# Patient Record
Sex: Male | Born: 1980 | Race: Black or African American | Hispanic: No | Marital: Married | Smoking: Never smoker
Health system: Southern US, Community
[De-identification: ages and names within clinical notes are randomized; demographics above are authoritative.]

## PROBLEM LIST (undated history)

## (undated) DIAGNOSIS — Z9889 Other specified postprocedural states: Secondary | ICD-10-CM

## (undated) HISTORY — PX: KNEE SURGERY: SHX244

---
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
## 2001-08-23 ENCOUNTER — Emergency Department (HOSPITAL_COMMUNITY): Admission: EM | Admit: 2001-08-23 | Discharge: 2001-08-23 | Payer: Self-pay | Admitting: Emergency Medicine

## 2002-07-21 ENCOUNTER — Emergency Department (HOSPITAL_COMMUNITY): Admission: EM | Admit: 2002-07-21 | Discharge: 2002-07-22 | Payer: Self-pay | Admitting: Emergency Medicine

## 2002-07-21 ENCOUNTER — Encounter: Payer: Self-pay | Admitting: Emergency Medicine

## 2004-04-26 ENCOUNTER — Emergency Department (HOSPITAL_COMMUNITY): Admission: EM | Admit: 2004-04-26 | Discharge: 2004-04-26 | Payer: Self-pay | Admitting: Emergency Medicine

## 2016-09-29 ENCOUNTER — Emergency Department (HOSPITAL_COMMUNITY): Payer: Self-pay

## 2016-09-29 ENCOUNTER — Emergency Department (HOSPITAL_COMMUNITY)
Admission: EM | Admit: 2016-09-29 | Discharge: 2016-09-29 | Disposition: A | Discharge status: Home / Self Care | Payer: Self-pay | Attending: Emergency Medicine | Admitting: Emergency Medicine

## 2016-09-29 ENCOUNTER — Encounter (HOSPITAL_COMMUNITY): Payer: Self-pay

## 2016-09-29 DIAGNOSIS — Y999 Unspecified external cause status: Secondary | ICD-10-CM | POA: Insufficient documentation

## 2016-09-29 DIAGNOSIS — Y939 Activity, unspecified: Secondary | ICD-10-CM | POA: Insufficient documentation

## 2016-09-29 DIAGNOSIS — Y929 Unspecified place or not applicable: Secondary | ICD-10-CM | POA: Insufficient documentation

## 2016-09-29 DIAGNOSIS — M25532 Pain in left wrist: Secondary | ICD-10-CM | POA: Insufficient documentation

## 2016-09-29 MED ORDER — IBUPROFEN 600 MG PO TABS: 600.0000 mg | ORAL_TABLET | Freq: Four times a day (QID) | ORAL | 0 refills | Status: DC | PRN
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
## 2016-09-29 NOTE — Discharge Instructions (Signed)
Take the ibuprofen as prescribed as needed for pain. Be sure to eat with this medication as it can be hard in your stomach. Ice your wrist as often as you can, 20 minutes on, 20 minutes off. Follow up at [facility] in 3-4 days if your symptoms are not improving. Return to the emergency department if you experience numbness, swelling of your hand, blueness of your fingers, weakness, fever or any other concerning symptoms.

## 2016-09-29 NOTE — ED Notes (Signed)
Patient verbalized understanding of discharge instructions and denies any further needs or questions at this time. VS stable. Patient ambulatory with steady gait.  
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
## 2016-10-01 ENCOUNTER — Encounter (HOSPITAL_COMMUNITY): Payer: Self-pay | Admitting: Emergency Medicine

## 2016-10-01 ENCOUNTER — Emergency Department (HOSPITAL_COMMUNITY)
Admission: EM | Admit: 2016-10-01 | Discharge: 2016-10-01 | Disposition: A | Discharge status: Home / Self Care | Payer: Self-pay | Attending: Emergency Medicine | Admitting: Emergency Medicine

## 2016-10-01 DIAGNOSIS — J029 Acute pharyngitis, unspecified: Secondary | ICD-10-CM | POA: Insufficient documentation

## 2016-10-01 MED ORDER — KETOROLAC TROMETHAMINE 30 MG/ML IJ SOLN
30.0000 mg | Freq: Once | INTRAMUSCULAR | Status: AC
  Administered 2016-10-01: 30 mg via INTRAVENOUS
  Filled 2016-10-01: qty 1

## 2016-10-01 MED ORDER — ACETAMINOPHEN 500 MG PO TABS
1000.0000 mg | ORAL_TABLET | Freq: Once | ORAL | Status: AC
  Administered 2016-10-01: 1000 mg via ORAL
  Filled 2016-10-01: qty 2

## 2016-10-01 MED ORDER — METOCLOPRAMIDE HCL 10 MG PO TABS
10.0000 mg | ORAL_TABLET | Freq: Once | ORAL | Status: AC
  Administered 2016-10-01: 10 mg via ORAL
  Filled 2016-10-01: qty 1

## 2016-10-01 MED ORDER — DEXAMETHASONE SODIUM PHOSPHATE 10 MG/ML IJ SOLN
10.0000 mg | Freq: Once | INTRAMUSCULAR | Status: AC
  Administered 2016-10-01: 10 mg via INTRAVENOUS
  Filled 2016-10-01: qty 1

## 2016-10-01 NOTE — ED Triage Notes (Signed)
Pt c/o fever, 10/10 HA, sore throat, for the last 2 days. Pt having a temp on arrival of 100.5 oral. Denies any nausea, vomiting or diarrhea.

## 2016-10-01 NOTE — ED Notes (Signed)
Pt just finished drinking a cup of water so could not check it Orally. Axillary was 101.1
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
## 2018-09-03 ENCOUNTER — Other Ambulatory Visit: Payer: Self-pay

## 2018-09-03 ENCOUNTER — Encounter (HOSPITAL_COMMUNITY): Payer: Self-pay

## 2018-09-03 ENCOUNTER — Emergency Department (HOSPITAL_COMMUNITY)
Admission: EM | Admit: 2018-09-03 | Discharge: 2018-09-03 | Disposition: A | Discharge status: Home / Self Care | Payer: Self-pay | Attending: Emergency Medicine | Admitting: Emergency Medicine

## 2018-09-03 DIAGNOSIS — J039 Acute tonsillitis, unspecified: Secondary | ICD-10-CM | POA: Insufficient documentation

## 2018-09-03 LAB — GROUP A STREP BY PCR: Group A Strep by PCR: NOT DETECTED

## 2018-09-03 MED ORDER — PREDNISONE 10 MG PO TABS: 40.0000 mg | ORAL_TABLET | Freq: Every day | ORAL | 0 refills | Status: DC

## 2018-09-03 MED ORDER — IBUPROFEN 800 MG PO TABS: 800.0000 mg | ORAL_TABLET | Freq: Three times a day (TID) | ORAL | 0 refills | Status: DC

## 2018-09-03 MED ORDER — CLINDAMYCIN HCL 150 MG PO CAPS: 300.0000 mg | ORAL_CAPSULE | Freq: Four times a day (QID) | ORAL | 0 refills | Status: AC

## 2018-09-03 MED ORDER — PREDNISONE 20 MG PO TABS
60.0000 mg | ORAL_TABLET | Freq: Once | ORAL | Status: AC
  Administered 2018-09-03: 60 mg via ORAL
  Filled 2018-09-03: qty 3

## 2018-09-03 MED ORDER — IBUPROFEN 400 MG PO TABS
600.0000 mg | ORAL_TABLET | Freq: Once | ORAL | Status: AC
  Administered 2018-09-03: 600 mg via ORAL
  Filled 2018-09-03: qty 1

## 2018-09-03 MED ORDER — ACETAMINOPHEN 325 MG PO TABS
650.0000 mg | ORAL_TABLET | Freq: Once | ORAL | Status: AC
  Administered 2018-09-03: 650 mg via ORAL
  Filled 2018-09-03: qty 2

## 2018-09-03 NOTE — ED Notes (Signed)
Patient verbalizes understanding of discharge instructions. Opportunity for questioning and answers were provided. Armband removed by staff, pt discharged from ED to his home via POV.  

## 2018-09-03 NOTE — Discharge Instructions (Signed)
Please take all of your antibiotics until finished!   You may develop abdominal discomfort or diarrhea from the antibiotic.  You may help offset this with probiotics which you can buy or get in yogurt. Do not eat or take the probiotics until 2 hours after your antibiotic. Do not take your medicine if develop an itchy rash, swelling in your mouth or lips, or difficulty breathing.   Prednisone - This is an oral steroid.  This medication is best taken with food in the morning.  Please note that this medication can cause anxiety, mood swings, muscle fatigue, increased hunger, weight gain (sodium/fluid retention), poor sleep as well as other symptoms. If you are a diabetic, please monitor your blood sugars at home as this medication can increase your blood sugars.   For pain and fever control you may take: 800mg  of ibuprofen (that is usually four 200mg  over the counter pills) up to 3 times a day (please take with food) and acetaminophen 975mg  (this is 3 normal strength, 325mg , over the counter pills) up to four times a day. Please do not take more than this. Do not drink alcohol or combine with other medications that have acetaminophen or Ibuprofen as an ingredient (Read the labels!).   Follow attached handout.   If you develop worsening or new concerning symptoms you can return to the emergency department for re-evaluation.

## 2018-09-03 NOTE — ED Provider Notes (Signed)
[facility] EMERGENCY DEPARTMENT Provider Note   CSN: [id number] Arrival date & time: 09/03/18  1708     History   Chief Complaint Chief Complaint  Patient presents with  . Fever  . Sore Throat    HPI Shawn Molina is a 37 y.o. male with no significant past medical history who presents with fever and sore throat that began on thursday. The patient reports of having fever of 102 as well as sore throat with associated dysphasia.  He has been using ibuprofen for his symptoms last dose at noon today.  Nothing has been making his symptoms better. He denies any sick contacts. Denies inability to control secretions, N/V, abdominal pain, cough, congestion, voice change, dental disease, oral sex, or trauma.   HPI  History reviewed. No pertinent past medical history.  There are no active problems to display for this patient.   History reviewed. No pertinent surgical history.      Home Medications    Prior to Admission medications   Medication Sig Start Date End Date Taking? Authorizing Provider  ibuprofen (ADVIL,MOTRIN) 600 MG tablet Take 1 tablet (600 mg total) by mouth every 6 (six) hours as needed. 09/29/16   Jerre Simon, PA    Family History History reviewed. No pertinent family history.  Social History Social History   Tobacco Use  . Smoking status: Never Smoker  . Smokeless tobacco: Never Used  Substance Use Topics  . Alcohol use: Yes    Comment: social  . Drug use: Yes    Types: Marijuana    Comment: occ     Allergies   Patient has no known allergies.   Review of Systems Review of Systems  Constitutional: Positive for fever.  HENT: Positive for sore throat. Negative for congestion, dental problem, facial swelling, rhinorrhea, sinus pressure, sinus pain and trouble swallowing.   Eyes: Negative for itching.  Respiratory: Negative for cough and shortness of breath.   Cardiovascular: Negative for chest pain.  Gastrointestinal:  Negative for nausea and vomiting.  Skin: Negative for rash.     Physical Exam Updated Vital Signs BP 120/69   Pulse 88   Temp 99.7 F (37.6 C) (Oral)   Resp 20   Ht 5\' 11"  (1.803 m)   Wt 111.1 kg   SpO2 99%   BMI 34.17 kg/m   Physical Exam  Constitutional: He appears well-developed and well-nourished. No distress.  HENT:  Head: Normocephalic and atraumatic.  Right Ear: Hearing, tympanic membrane, external ear and ear canal normal. No foreign bodies. No mastoid tenderness. Tympanic membrane is not injected, not perforated, not erythematous, not retracted and not bulging.  Left Ear: Hearing, tympanic membrane, external ear and ear canal normal. No foreign bodies. No mastoid tenderness. Tympanic membrane is not injected, not perforated, not erythematous, not retracted and not bulging.  Nose: Nose normal. No mucosal edema, rhinorrhea, sinus tenderness, septal deviation or nasal septal hematoma.  No foreign bodies. Right sinus exhibits no maxillary sinus tenderness and no frontal sinus tenderness. Left sinus exhibits no maxillary sinus tenderness and no frontal sinus tenderness.  The patient has normal phonation and is in control of secretions. No stridor.  Midline uvula without edema. Soft palate rises symmetrically. 3+ tonsils b/l with tonsillar erythema and exudates. No PTA. Tongue protrusion is normal. No trismus. No creptius on neck palpation and patient has good dentition. No gingival erythema or fluctuance noted. Mucus membranes moist.  Eyes: Pupils are equal, round, and reactive to light. Conjunctivae,  EOM and lids are normal. Right eye exhibits no discharge. Left eye exhibits no discharge. Right conjunctiva is not injected. Left conjunctiva is not injected.  Neck: Trachea normal, normal range of motion, full passive range of motion without pain and phonation normal. Neck supple. No spinous process tenderness and no muscular tenderness present. No neck rigidity. No tracheal deviation  and normal range of motion present.  No nuchal rigidity or meningismus  Cardiovascular: Normal rate and regular rhythm.  Pulmonary/Chest: Effort normal and breath sounds normal. No stridor. He has no wheezes.  Abdominal: Soft.  Lymphadenopathy:       Head (right side): No submental, no submandibular, no tonsillar, no preauricular, no posterior auricular and no occipital adenopathy present.       Head (left side): No submental, no submandibular, no tonsillar, no preauricular, no posterior auricular and no occipital adenopathy present.    He has cervical adenopathy.  Neurological: He is alert.  Skin: Skin is warm and dry. No rash noted.  Psychiatric: He has a normal mood and affect.  Nursing note and vitals reviewed.    ED Treatments / Results  Labs (all labs ordered are listed, but only abnormal results are displayed) Labs Reviewed  GROUP A STREP BY PCR    EKG None  Radiology No results found.  Procedures Procedures (including critical care time)  Medications Ordered in ED Medications  ibuprofen (ADVIL,MOTRIN) tablet 600 mg (has no administration in time range)  acetaminophen (TYLENOL) tablet 650 mg (has no administration in time range)  predniSONE (DELTASONE) tablet 60 mg (has no administration in time range)     Initial Impression / Assessment and Plan / ED Course  I have reviewed the triage vital signs and the nursing notes.  Pertinent labs & imaging results that were available during my care of the patient were reviewed by me and considered in my medical decision making (see chart for details).     37 y.o. male with fever, tonsillar edema/exudates with cervical lymphadenopathy and reported dysphasia.  No evidence of PTA or RPA.  Patient symptoms and exam consistent with bacterial tonsillitis.  Patient treated in the ED with steroids, NSAIDs.  Will be discharged home on short burst of steroids as well as clindamycin.  He has intact airway and tolerating greater than 6  ounces of fluid.  Specific return precautions were discussed.  Patient is to follow-up with his primary care provider this week.  Patient appears safe for discharge.  Final Clinical Impressions(s) / ED Diagnoses   Final diagnoses:  Tonsillitis    ED Discharge Orders         Ordered    predniSONE (DELTASONE) 10 MG tablet  Daily     09/03/18 2007    clindamycin (CLEOCIN) 150 MG capsule  Every 6 hours     09/03/18 2007    ibuprofen (ADVIL,MOTRIN) 800 MG tablet  3 times daily     09/03/18 2007           Princella Pellegrini 09/04/18 Juventino Slovak, MD 09/07/18 1504

## 2018-09-03 NOTE — ED Notes (Signed)
Pt complains of a sore throat for 4 days. Pt states warm tea helps but the pain comes back again.

## 2018-09-03 NOTE — ED Triage Notes (Signed)
Pt here for a febrile illness over the last week.  Endorses sore throat for the same time.  Treating with Asprin and hot tea without resolution of soreness.  A&Ox4.

## 2019-02-03 ENCOUNTER — Other Ambulatory Visit: Payer: Self-pay

## 2019-02-03 ENCOUNTER — Emergency Department (HOSPITAL_COMMUNITY)
Admission: EM | Admit: 2019-02-03 | Discharge: 2019-02-03 | Disposition: A | Discharge status: Home / Self Care | Payer: Self-pay | Attending: Emergency Medicine | Admitting: Emergency Medicine

## 2019-02-03 ENCOUNTER — Encounter (HOSPITAL_COMMUNITY): Payer: Self-pay

## 2019-02-03 DIAGNOSIS — L0201 Cutaneous abscess of face: Secondary | ICD-10-CM | POA: Insufficient documentation

## 2019-02-03 DIAGNOSIS — L03213 Periorbital cellulitis: Secondary | ICD-10-CM | POA: Insufficient documentation

## 2019-02-03 DIAGNOSIS — F1210 Cannabis abuse, uncomplicated: Secondary | ICD-10-CM | POA: Insufficient documentation

## 2019-02-03 HISTORY — DX: Other specified postprocedural states: Z98.890

## 2019-02-03 MED ORDER — SULFAMETHOXAZOLE-TRIMETHOPRIM 800-160 MG PO TABS: 1.0000 | ORAL_TABLET | Freq: Two times a day (BID) | ORAL | 0 refills | Status: AC

## 2019-02-03 MED ORDER — AMOXICILLIN-POT CLAVULANATE 875-125 MG PO TABS: 1.0000 | ORAL_TABLET | Freq: Two times a day (BID) | ORAL | 0 refills | Status: AC

## 2019-02-03 MED ORDER — SULFAMETHOXAZOLE-TRIMETHOPRIM 800-160 MG PO TABS
1.0000 | ORAL_TABLET | Freq: Once | ORAL | Status: AC
  Administered 2019-02-03: 1 via ORAL
  Filled 2019-02-03: qty 1

## 2019-02-03 MED ORDER — LIDOCAINE HCL 2 % IJ SOLN
10.0000 mL | Freq: Once | INTRAMUSCULAR | Status: AC
  Administered 2019-02-03: 200 mg
  Filled 2019-02-03: qty 20

## 2019-02-03 MED ORDER — AMOXICILLIN-POT CLAVULANATE 875-125 MG PO TABS
1.0000 | ORAL_TABLET | Freq: Once | ORAL | Status: AC
  Administered 2019-02-03: 1 via ORAL
  Filled 2019-02-03: qty 1

## 2019-02-03 NOTE — ED Provider Notes (Signed)
[facility]-EMERGENCY DEPT Provider Note   CSN: [id number] Arrival date & time: 02/03/19  0750    History   Chief Complaint Chief Complaint  Patient presents with  . Abscess    HPI Shawn Molina is a 38 y.o. male.     HPI  Patient is a 38 year old male with no significant past medical history presenting for "lump" superior to the left eyebrow and periorbital swelling on the left.  Patient reports that he began noticing the lump approximately 3 weeks ago.  Reports over the past 48 hours he has had progressive increase in erythema overlying the lump, as well as some swelling surrounding the eye.  Patient denies any visual disturbance, pain with extraocular motions, or fever or chills.  No history of immune compromise status.  Past Medical History:  Diagnosis Date  . H/O left knee surgery     There are no active problems to display for this patient.   History reviewed. No pertinent surgical history.      Home Medications    Prior to Admission medications   Medication Sig Start Date End Date Taking? Authorizing Provider  ibuprofen (ADVIL,MOTRIN) 800 MG tablet Take 1 tablet (800 mg total) by mouth 3 (three) times daily. 09/03/18   Maczis, Elmer Sow, PA-C  predniSONE (DELTASONE) 10 MG tablet Take 4 tablets (40 mg total) by mouth daily. 09/03/18   Maczis, Elmer Sow, PA-C    Family History Family History  Problem Relation Age of Onset  . Cancer Mother     Social History Social History   Tobacco Use  . Smoking status: Never Smoker  . Smokeless tobacco: Never Used  Substance Use Topics  . Alcohol use: Yes    Comment: social  . Drug use: Yes    Types: Marijuana    Comment: occ     Allergies   Patient has no known allergies.   Review of Systems Review of Systems  Constitutional: Negative for chills and fever.  HENT: Negative for congestion and sore throat.   Eyes: Positive for redness. Negative for pain, discharge and visual  disturbance.  Respiratory: Negative for chest tightness.   Cardiovascular: Negative for chest pain.  Gastrointestinal: Negative for abdominal pain, nausea and vomiting.  Genitourinary: Negative for dysuria and flank pain.  Musculoskeletal: Negative for back pain and myalgias.  Skin: Negative for rash.  Allergic/Immunologic: Negative for immunocompromised state.  Neurological: Negative for dizziness, syncope and headaches.     Physical Exam Updated Vital Signs BP (!) 140/91 (BP Location: Right Arm)   Pulse 83   Temp 99.5 F (37.5 C) (Oral)   Resp 19   Ht 6' (1.829 m)   Wt 111.1 kg   SpO2 99%   BMI 33.23 kg/m   Physical Exam Vitals signs and nursing note reviewed.  Constitutional:      General: He is not in acute distress.    Appearance: He is well-developed.  HENT:     Head: Normocephalic and atraumatic.  Eyes:     Conjunctiva/sclera: Conjunctivae normal.     Pupils: Pupils are equal, round, and reactive to light.   Neck:     Musculoskeletal: Normal range of motion and neck supple.  Cardiovascular:     Rate and Rhythm: Normal rate and regular rhythm.     Heart sounds: S1 normal and S2 normal. No murmur.  Pulmonary:     Effort: Pulmonary effort is normal.     Breath sounds: Normal breath sounds. No wheezing or  rales.  Abdominal:     General: There is no distension.     Palpations: Abdomen is soft.     Tenderness: There is no abdominal tenderness. There is no guarding.  Musculoskeletal: Normal range of motion.        General: No deformity.  Lymphadenopathy:     Cervical: No cervical adenopathy.  Skin:    General: Skin is warm and dry.     Findings: No erythema or rash.  Neurological:     Mental Status: He is alert.     Comments: Cranial nerves grossly intact. Patient moves extremities symmetrically and with good coordination.  Psychiatric:        Behavior: Behavior normal.        Thought Content: Thought content normal.        Judgment: Judgment normal.       ED Treatments / Results  Labs (all labs ordered are listed, but only abnormal results are displayed) Labs Reviewed - No data to display  EKG None  Radiology No results found.  Procedures .Marland KitchenIncision and Drainage Date/Time: 02/03/2019 10:20 AM Performed by: Elisha Ponder, PA-C Authorized by: Elisha Ponder, PA-C   Consent:    Consent obtained:  Verbal   Consent given by:  Patient   Risks discussed:  Bleeding, incomplete drainage and pain Location:    Type:  Abscess   Location:  Head   Head location:  Face (Superior to left eyebrow) Pre-procedure details:    Skin preparation:  Betadine Anesthesia (see MAR for exact dosages):    Anesthesia method:  Local infiltration   Local anesthetic:  Lidocaine 2% w/o epi Procedure type:    Complexity:  Simple Procedure details:    Incision types:  Stab incision   Incision depth:  Dermal   Scalpel blade:  11   Wound management:  Probed and deloculated   Drainage:  Purulent   Drainage amount:  Moderate   Wound treatment:  Wound left open   Packing materials:  None Post-procedure details:    Patient tolerance of procedure:  Tolerated well, no immediate complications   (including critical care time)  Medications Ordered in ED Medications  lidocaine (XYLOCAINE) 2 % (with pres) injection 200 mg (has no administration in time range)  sulfamethoxazole-trimethoprim (BACTRIM DS,SEPTRA DS) 800-160 MG per tablet 1 tablet (has no administration in time range)  amoxicillin-clavulanate (AUGMENTIN) 875-125 MG per tablet 1 tablet (has no administration in time range)     Initial Impression / Assessment and Plan / ED Course  I have reviewed the triage vital signs and the nursing notes.  Pertinent labs & imaging results that were available during my care of the patient were reviewed by me and considered in my medical decision making (see chart for details).        Patient nontoxic-appearing, afebrile, and in no acute  distress.  Patient with small abscess superior to the left eyebrow.  Patient has no immune compromising risk factors.  Patient with small abscess superior to the left eyebrow, with surrounding periorbital edema, possibly early periorbital cellulitis.  There is no proptosis, pain with extraocular motions, visual disturbance, or concerning signs or symptoms on exam for post septal cellulitis.  Incision and drainage was performed successfully.  Patient placed on Bactrim and Augmentin for periorbital cellulitis regimen.  Patient was given strict return precautions for return of abscess, any increasing swelling, redness, visual disturbance, or pain behind the eye.  Patient is in understanding and agrees to the plan of  care.  Final Clinical Impressions(s) / ED Diagnoses   Final diagnoses:  Abscess of face  Periorbital cellulitis of left eye    ED Discharge Orders         Ordered    sulfamethoxazole-trimethoprim (BACTRIM DS,SEPTRA DS) 800-160 MG tablet  2 times daily     02/03/19 1015    amoxicillin-clavulanate (AUGMENTIN) 875-125 MG tablet  Every 12 hours     02/03/19 1015           Delia Chimes 02/03/19 1021    Alvira Monday, MD 02/03/19 2114

## 2019-02-03 NOTE — Discharge Instructions (Addendum)
Please see the information and instructions below regarding your visit.  Your diagnoses today include:  1. Abscess of face   2. Periorbital cellulitis of left eye     Abscesses form when an infection in your skin starts to collect bacteria and white blood cells, walling it off from the rest of your body to protect you from a bigger infection. Risk factors for this type of infection include:  ?Break in the skin ?Diabetes ?Swollen areas  Sometimes the infection starts to spread to surrounding tissue, causing redness and swelling. We call this cellulitis.   Tests performed today include: See side panel of your discharge paperwork for testing performed today. Vital signs are listed at the bottom of these instructions.   Medications prescribed:    Take any prescribed medications only as prescribed, and any over the counter medications only as directed on the packaging.  1. Please take all of your antibiotics until finished.   You may develop abdominal discomfort or nausea from the antibiotic. If this occurs, you may take it with food. Some patients also get diarrhea with antibiotics. You may help offset this with probiotics which you can buy or get in yogurt. Do not eat or take the probiotics until 2 hours after your antibiotic. Some women develop vaginal yeast infections after antibiotics. If you develop unusual vaginal discharge after being on this medication, please see your primary care provider.   Some people develop allergies to antibiotics. Symptoms of antibiotic allergy can be mild and include a flat rash and itching. They can also be more serious and include:  ?Hives - Hives are raised, red patches of skin that are usually very itchy.  ?Lip or tongue swelling  ?Trouble swallowing or breathing  ?Blistering of the skin or mouth.  If you have any of these serious symptoms, please seek emergency medical care immediately.  2.  I recommend Tylenol, 650 mg every 6 hours as needed for  pain.  Do not exceed 4000 mg in 1 day.  Home care instructions:  Please follow any educational materials contained in this packet.   Some things that may promote healing of your wound and infection include:  Keep the infected area clean and dry. You can take a shower or bath, but be sure to pat the area dry with a towel afterward. Do not put any antibiotic ointments or creams on the area. Reapply a dry gauze dressing any time the bandage has become soaked with drainage, or after cleansing the wound.  Apply warm compresses to the wound 3-4 times daily to encourage drainage.   Return instructions:  Please return to the Emergency Department if you experience worsening symptoms. You should return for reevaluation of your infection if you notice spreading redness, increased swelling, an abscess develops, or you develop signs and symptoms of a systemic illness such as fever and chills.  I expect the swelling to not change much over 24 hours.  If after 24 hours there is increasing swelling, redness, or puffiness about the eye, please return. Please return if you have any other emergent concerns.  Additional Information:   Your vital signs today were: BP (!) 140/91 (BP Location: Right Arm)    Pulse 83    Temp 99.5 F (37.5 C) (Oral)    Resp 19    Ht 6' (1.829 m)    Wt 111.1 kg    SpO2 99%    BMI 33.23 kg/m  If your blood pressure (BP) was elevated on multiple readings  during this visit above 130 for the top number or above 80 for the bottom number, please have this repeated by your primary care provider within one month. --------------  Thank you for allowing [place] to participate in your care today.

## 2019-02-03 NOTE — ED Triage Notes (Signed)
Patient c/o small abscess to the left eyebrow area since yesterday.

## 2019-02-13 ENCOUNTER — Emergency Department (HOSPITAL_COMMUNITY)
Admission: EM | Admit: 2019-02-13 | Discharge: 2019-02-13 | Disposition: A | Discharge status: Home / Self Care | Payer: Self-pay | Attending: Emergency Medicine | Admitting: Emergency Medicine

## 2019-02-13 ENCOUNTER — Other Ambulatory Visit: Payer: Self-pay

## 2019-02-13 ENCOUNTER — Encounter (HOSPITAL_COMMUNITY): Payer: Self-pay

## 2019-02-13 DIAGNOSIS — R1013 Epigastric pain: Secondary | ICD-10-CM | POA: Insufficient documentation

## 2019-02-13 LAB — COMPREHENSIVE METABOLIC PANEL
ALBUMIN: 4.2 g/dL (ref 3.5–5.0)
ALT: 41 U/L (ref 0–44)
AST: 49 U/L — AB (ref 15–41)
Alkaline Phosphatase: 67 U/L (ref 38–126)
Anion gap: 8 (ref 5–15)
BILIRUBIN TOTAL: 0.7 mg/dL (ref 0.3–1.2)
BUN: 13 mg/dL (ref 6–20)
CO2: 27 mmol/L (ref 22–32)
CREATININE: 1.07 mg/dL (ref 0.61–1.24)
Calcium: 9.1 mg/dL (ref 8.9–10.3)
Chloride: 104 mmol/L (ref 98–111)
GFR calc Af Amer: >60 mL/min (ref >60)
GLUCOSE: 95 mg/dL (ref 70–99)
Potassium: 4.6 mmol/L (ref 3.5–5.1)
Sodium: 139 mmol/L (ref 135–145)
Total Protein: 7.9 g/dL (ref 6.5–8.1)

## 2019-02-13 LAB — CBC
HEMATOCRIT: 43.5 % (ref 39.0–52.0)
Hemoglobin: 13.6 g/dL (ref 13.0–17.0)
MCH: 28.3 pg (ref 26.0–34.0)
MCHC: 31.3 g/dL (ref 30.0–36.0)
MCV: 90.4 fL (ref 80.0–100.0)
Platelets: 273 10*3/uL (ref 150–400)
RBC: 4.81 MIL/uL (ref 4.22–5.81)
RDW: 14.4 % (ref 11.5–15.5)
WBC: 5.8 10*3/uL (ref 4.0–10.5)
nRBC: 0 % (ref 0.0–0.2)

## 2019-02-13 LAB — URINALYSIS, ROUTINE W REFLEX MICROSCOPIC
Bilirubin Urine: NEGATIVE
Glucose, UA: NEGATIVE mg/dL
Hgb urine dipstick: NEGATIVE
Ketones, ur: NEGATIVE mg/dL
Leukocytes,Ua: NEGATIVE
NITRITE: NEGATIVE
PROTEIN: 30 mg/dL — AB
Specific Gravity, Urine: 1.025 (ref 1.005–1.030)
pH: 8 (ref 5.0–8.0)

## 2019-02-13 LAB — LIPASE, BLOOD: Lipase: 42 U/L (ref 11–51)

## 2019-02-13 MED ORDER — SODIUM CHLORIDE 0.9% FLUSH: 3.0000 mL | Freq: Once | INTRAVENOUS | Status: DC

## 2019-02-13 MED ORDER — ONDANSETRON 4 MG PO TBDP: 4.0000 mg | ORAL_TABLET | Freq: Three times a day (TID) | ORAL | 0 refills | Status: DC | PRN

## 2019-02-13 NOTE — ED Provider Notes (Signed)
[facility]-EMERGENCY DEPT Provider Note   CSN: [id number] Arrival date & time: 02/13/19  0259    History   Chief Complaint Chief Complaint  Patient presents with  . Abdominal Pain    HPI Shawn Molina is a 38 y.o. male with no significant past medical history is here for evaluation of abdominal pain.  Onset 1 day ago.  Sudden, constant described as crampy are around his bellybutton with intermittent radiation to his epigastrium and left upper quadrant.  Associated with nausea and one episode of nonbilious nonbloody emesis.  No changes to pain with eating.  Reports intermittent issues with indigestion that have slightly worsened.  Does not take any medicines for this.  No interventions for this.  States 2 weeks ago he was seen in the ER for an eye infection.  He has been taking Bactrim twice a day since with food and thinks that this may be causing his symptoms.  Partner at bedside states last time he took Bactrim he had similar abdominal pain with diarrhea.    Patient denies sick contacts.  No associated fevers, chills, chest pain, shortness of breath, pleuritic pain, changes to bowel movements, urinary symptoms.  No previous abdominal surgeries.  Occasional Aleve use as needed for aches and pains but not frequently.  No EtOH use.  No history of pancreatitis, gastritis, PUD. Since arrival to ER pain has resolved, currently 0/10.        HPI  Past Medical History:  Diagnosis Date  . H/O left knee surgery     There are no active problems to display for this patient.   History reviewed. No pertinent surgical history.      Home Medications    Prior to Admission medications   Medication Sig Start Date End Date Taking? Authorizing Provider  ibuprofen (ADVIL,MOTRIN) 800 MG tablet Take 1 tablet (800 mg total) by mouth 3 (three) times daily. Patient not taking: Reported on 02/13/2019 09/03/18   Jacinto Halim, PA-C  ondansetron (ZOFRAN ODT) 4 MG  disintegrating tablet Take 1 tablet (4 mg total) by mouth every 8 (eight) hours as needed for nausea or vomiting. 02/13/19   Liberty Handy, PA-C  predniSONE (DELTASONE) 10 MG tablet Take 4 tablets (40 mg total) by mouth daily. Patient not taking: Reported on 02/13/2019 09/03/18   Jacinto Halim, PA-C    Family History Family History  Problem Relation Age of Onset  . Cancer Mother     Social History Social History   Tobacco Use  . Smoking status: Never Smoker  . Smokeless tobacco: Never Used  Substance Use Topics  . Alcohol use: Yes    Comment: social  . Drug use: Yes    Types: Marijuana    Comment: occ     Allergies   Patient has no known allergies.   Review of Systems Review of Systems  Gastrointestinal: Positive for abdominal pain, nausea and vomiting.  All other systems reviewed and are negative.    Physical Exam Updated Vital Signs BP 124/87 (BP Location: Left Arm)   Pulse 60   Temp 98.6 F (37 C) (Oral)   Resp 18   Ht 6' (1.829 m)   Wt 113.4 kg   SpO2 99%   BMI 33.91 kg/m   Physical Exam Vitals signs and nursing note reviewed.  Constitutional:      Appearance: He is well-developed.     Comments: Non toxic.  HENT:     Head: Normocephalic and atraumatic.  Nose: Nose normal.  Eyes:     Conjunctiva/sclera: Conjunctivae normal.     Pupils: Pupils are equal, round, and reactive to light.  Neck:     Musculoskeletal: Normal range of motion.  Cardiovascular:     Rate and Rhythm: Normal rate and regular rhythm.     Heart sounds: Normal heart sounds.  Pulmonary:     Effort: Pulmonary effort is normal.     Breath sounds: Normal breath sounds.  Abdominal:     General: Bowel sounds are normal.     Palpations: Abdomen is soft.     Tenderness: There is no abdominal tenderness.     Comments: No G/R/R. No suprapubic or CVA tenderness. Negative Murphy's and McBurney's  Musculoskeletal: Normal range of motion.  Skin:    General: Skin is warm and  dry.     Capillary Refill: Capillary refill takes less than 2 seconds.  Neurological:     Mental Status: He is alert and oriented to person, place, and time.  Psychiatric:        Behavior: Behavior normal.        Thought Content: Thought content normal.        Judgment: Judgment normal.      ED Treatments / Results  Labs (all labs ordered are listed, but only abnormal results are displayed) Labs Reviewed  COMPREHENSIVE METABOLIC PANEL - Abnormal; Notable for the following components:      Result Value   AST 49 (*)    All other components within normal limits  URINALYSIS, ROUTINE W REFLEX MICROSCOPIC - Abnormal; Notable for the following components:   APPearance HAZY (*)    Protein, ur 30 (*)    Bacteria, UA RARE (*)    All other components within normal limits  LIPASE, BLOOD  CBC    EKG None  Radiology No results found.  Procedures Procedures (including critical care time)  Medications Ordered in ED Medications  sodium chloride flush (NS) 0.9 % injection 3 mL (has no administration in time range)     Initial Impression / Assessment and Plan / ED Course  I have reviewed the triage vital signs and the nursing notes.  Pertinent labs & imaging results that were available during my care of the patient were reviewed by me and considered in my medical decision making (see chart for details).       Suspect symptoms may be viral process versus gastritis versus GERD versus PUD.  Recent antibiotic use could be contributing as well.  Upon arrival to the ER patient's pain had completely resolved.  Exam is reassuring without fever, abdominal tenderness.  Negative Murphy's and McBurney's.  He has no associated chest pain or shortness of breath, doubt this is referred cardiac etiology pain.  No urinary symptoms.  I have low suspicion for intraabdominal life-threatening issue that would require admission, advanced imaging such as pancreatitis, cholecystitis, appendicitis,  diverticulitis, SBO, mesenteric ischemia, UTI or pyelonephritis given his benign lab work, UA and exam.  EKG obtained at triage w/o ischemia.  He has noticed recent increase in indigestion.  We will discharge with omeprazole, famotidine and diet changes.  Return precautions were given.  Patient is comfortable with this.    Final Clinical Impressions(s) / ED Diagnoses   Final diagnoses:  Epigastric abdominal pain    ED Discharge Orders         Ordered    ondansetron (ZOFRAN ODT) 4 MG disintegrating tablet  Every 8 hours PRN     02/13/19  8022           Liberty Handy, PA-C 02/13/19 3361    Nira Conn, MD 02/13/19 [phone]

## 2019-02-13 NOTE — ED Notes (Signed)
Asked for urine  

## 2019-02-13 NOTE — Discharge Instructions (Addendum)
You were seen in the ER for fabdominal pain. Labs were normal. I suspect your symptoms may be from gastritis, acid reflux or side effect from antibiotic.  Could have also been a virus. All of these are managed similarly.   We will treat this with anti-acid medicines.  Start taking over the counter omeprazole to 40 mg daily, take on an empty stomach first thing in the morning and wait 20-30 min before eating.  Add famotidine 20 mg in the AM and PM, or as needed for acid reflux or indigestion.  Take tylenol for pain.  Zofran for nausea.   Avoid irritating foods and liquids such as alcohol, greasy/fatty or acidic foods. Avoid ibuprofen containing products.   Follow up with gastroenterology for further management of this if it persits.   Return to the ER for fever, chills, blood in vomit or stool, worsening localized abdominal pain to right upper or right lower abdomen, inability to tolerate fluids.

## 2019-02-13 NOTE — ED Triage Notes (Signed)
Pt states over the last two days he has had abdominal/epigastric pain, heart burn, and finger tingling.   Pt given antibiotics about a week ago for a cyst over the eye.

## 2019-03-06 ENCOUNTER — Telehealth: Payer: Self-pay | Admitting: Physician Assistant

## 2019-03-06 DIAGNOSIS — R509 Fever, unspecified: Secondary | ICD-10-CM

## 2019-03-06 NOTE — Progress Notes (Signed)
E-Visit for Corona Virus Screening  Based on your current symptoms, you may very well have the virus, however your symptoms are mild. Currently, not all patients are being tested. If the symptoms are mild and there is not a known exposure, performing the test is not indicated.  Coronavirus disease 2019 (COVID-19)is a respiratory illness that can spread from person to person. The virus that causes COVID-19 is a new virus that was first identified in the country of [place] but is now found in multiple other countries and has spread to the [place].  Symptoms associated with the virus are mild to severe fever, cough, and shortness of breath. There is currently no vaccine to protect against COVID-19, and there is no specific antiviral treatment for the virus.   To be considered HIGH RISK for Coronavirus (COVID-19), you have to meet the following criteria:  . Traveled to [place], [place], [place], [place] or [place]; or in the [place] to [place], [place], [place], or [place]; and have fever, cough, and shortness of breath within the last 2 weeks of travel OR  . Been in close contact with a person diagnosed with COVID-19 within the last 2 weeks and have fever, cough, and shortness of breath  . IF YOU DO NOT MEET THESE CRITERIA, YOU ARE CONSIDERED LOW RISK FOR COVID-19.   It is vitally important that if you feel that you have an infection such as this virus or any other virus that you stay home and away from places where you may spread it to others.  You should self-quarantine for 14 days if you have symptoms that could potentially be coronavirus and avoid contact with people age 40 and older.    You may also take acetaminophen (Tylenol) as needed for fever. For further treatment of sore throat, I recommend gargling warm salt water for 10-15 seconds as needed.     Reduce your risk of any infection by using the same precautions used for avoiding the common cold or flu: Wash your hands  often with soap and warm water for at least 20 seconds.  If soap and water are not readily available, use an alcohol-based hand sanitizer with at least 60% alcohol.  If coughing or sneezing, cover your mouth and nose by coughing or sneezing into the elbow areas of your shirt or coat, into a tissue or into your sleeve (not your hands). Avoid shaking hands with others and consider head nods or verbal greetings only.  Avoid touching your eyes,nose, or mouth with unwashed hands. Avoid close contact with people who are sick. Avoid places or events with large numbers of people in one location, like concerts or sporting events. Carefully consider travel plans you have or are making. If you are planning any travel outside or inside the [place], visit the CDC'sTravelers' Health webpagefor the latest health notices. If you have some symptoms but not all symptoms, continue to monitor at home and seek medical attention if your symptoms worsen. If you are having a medical emergency, call 911.  HOME CARE Only take medications as instructed by your medical team. Drink plenty of fluids and get plenty of rest. A steam or ultrasonic humidifier can help if you have congestion.   GET HELP RIGHT AWAY IF: You develop worsening fever. You become short of breath You cough up blood. Your symptoms become more severe MAKE SURE YOU  Understand these instructions. Will watch your condition. Will get help right away if you are not doing well  or get worse.  Your e-visit answers were reviewed by a board certified advanced clinical practitioner to complete your personal care plan.  Depending on the condition, your plan could have included both over the counter or prescription medications.  If there is a problem please reply once you have received a response from your provider. Your safety is important to [place].  If you have drug allergies check your prescription carefully.    You can use MyChart to ask questions about  today's visit, request a non-urgent call back, or ask for a work or school excuse for 24 hours related to this e-Visit. If it has been greater than 24 hours you will need to follow up with your provider, or enter a new e-Visit to address those concerns. You will get an e-mail in the next two days asking about your experience.  I hope that your e-visit has been valuable and will speed your recovery. Thank you for using e-visits.    ===View-only below this line===   ----- Message -----    From: Roanna Raider    Sent: 03/06/2019  8:45 AM EDT      To: E-Visit Mailing List Subject: E-Visit Submission: CoronaVirus (COVID-19) Screening  E-Visit Submission: CoronaVirus (COVID-19) Screening --------------------------------  Question: Do you have any of the following?  Answer:   Fever  Question: Do you have any of the following additional symptoms?  Answer:   Sore throat            Headache  Question: Have you had a fever? Answer:   Yes  Question: If you are running a fever, please type in your temperature reading Answer:   100.3  Question: How long have you had the fever? Answer:   Just today  Question: Have others in your home or workplace had similar symptoms? Answer:   No  Question: When did your symptoms start? Answer:   Sunday night  Question: Have you recently visited any of the following countries? Answer:   None of these  Question: If you have traveled anywhere in the last  2 months please document where you have visited: Answer:     Question: Have you recently been around others from these countries or visited these countries who have had coughing or fever? Answer:   No  Question: Have you recently been around anyone who has been diagnosed with Corona virus? Answer:   No  Question: Have you been taking any medications? Answer:   No  Question: If taking medications for these symptoms, please list the names and whether they are helping or not Answer:      Question: Are you treated for any of the following conditions: Asthma, COPD, Diabetes, Renal Failure (on Dialysis), AIDS, any Neuromuscular disease that effects the clearing of secretions, Heart Failure, or Heart Disease? Answer:   No  Question: Please enter a phone number where you can be reached if we have additional questions about your symptoms Answer:   [phone]  Question: Please list your medication allergies that you may have ? (If 'none' , please list as 'none') Answer:   None  Question: Please list any additional comments  Answer:   Came into the er a few months ago feeling the same way. I am also just really cold  A total of 5-10 minutes was spent evaluating this patients questionnaire and formulating a plan of care.

## 2019-03-07 ENCOUNTER — Other Ambulatory Visit: Payer: Self-pay

## 2019-03-07 ENCOUNTER — Ambulatory Visit (HOSPITAL_COMMUNITY)
Admission: EM | Admit: 2019-03-07 | Discharge: 2019-03-07 | Disposition: A | Discharge status: Home / Self Care | Payer: Self-pay | Attending: Emergency Medicine | Admitting: Emergency Medicine

## 2019-03-07 ENCOUNTER — Encounter (HOSPITAL_COMMUNITY): Payer: Self-pay | Admitting: Emergency Medicine

## 2019-03-07 DIAGNOSIS — J029 Acute pharyngitis, unspecified: Secondary | ICD-10-CM

## 2019-03-07 LAB — POCT RAPID STREP A: Streptococcus, Group A Screen (Direct): NEGATIVE

## 2019-03-07 MED ORDER — AMOXICILLIN 500 MG PO CAPS: 500.0000 mg | ORAL_CAPSULE | Freq: Two times a day (BID) | ORAL | 0 refills | Status: AC

## 2019-03-07 NOTE — Discharge Instructions (Addendum)
You were not tested for COVID today Strep test negative, will send out for culture and we will call you with results However, based on symptoms and physical exam, I will treat you for potential strep Get plenty of rest and push fluids Drink warm or cool liquids, use throat lozenges, or popsicles to help alleviate symptoms Take OTC tylenol as needed for pain and fever You should remain isolated in your home for 7 days from symptom onset AND greater than 72 hours after symptoms resolution (absence of fever without the use of fever-reducing medication and improvement in respiratory symptoms), whichever is longer  Call or go to ER if patient has any new or worsening symptoms such as fever, chills, nausea, vomiting, worsening sore throat, cough, shortness of breath, abdominal pain, chest pain, chest pressure, changes in bowel or bladder habits, altered mental status, etc..Marland Kitchen

## 2019-03-07 NOTE — ED Triage Notes (Signed)
Onset Sunday night of symptoms.  Complains of chills.  Reports fever 102 on Monday.  Throat feels tight. Complains of headache.  Has a sore throat.   Denies sob.   Denies cough

## 2019-03-07 NOTE — ED Provider Notes (Signed)
[facility]   [id number] 03/07/19 Arrival Time: 0935  XM:IWOE THROAT  SUBJECTIVE: History from: patient.  Shawn Molina is a 38 y.o. male who presents with chills, fever, tmax of 102 2 days ago, and sore throat x 3 days.  Denies positive sick exposure to COVID, flu or strep.  Denies recent travel.  Has tried advil with minimal relief.  Symptoms are made worse with swallowing, but tolerating liquids and own secretions without difficulty.  Reports previous symptoms in the past, treated with antibiotic at that time.   Denies fatigue, ear pain, sinus pain, rhinorrhea, nasal congestion, cough, SOB, wheezing, chest pain, nausea, rash, changes in bowel or bladder habits.    ROS: As per HPI.  Past Medical History:  Diagnosis Date  . H/O left knee surgery    Past Surgical History:  Procedure Laterality Date  . KNEE SURGERY Left    No Known Allergies No current facility-administered medications on file prior to encounter.    Current Outpatient Medications on File Prior to Encounter  Medication Sig Dispense Refill  . ibuprofen (ADVIL,MOTRIN) 800 MG tablet Take 1 tablet (800 mg total) by mouth 3 (three) times daily. 21 tablet 0   Social History   Socioeconomic History  . Marital status: Married    Spouse name: Not on file  . Number of children: Not on file  . Years of education: Not on file  . Highest education level: Not on file  Occupational History  . Not on file  Social Needs  . Financial resource strain: Not on file  . Food insecurity:    Worry: Not on file    Inability: Not on file  . Transportation needs:    Medical: Not on file    Non-medical: Not on file  Tobacco Use  . Smoking status: Never Smoker  . Smokeless tobacco: Never Used  Substance and Sexual Activity  . Alcohol use: Yes    Comment: social  . Drug use: Yes    Types: Marijuana    Comment: occ  . Sexual activity: Not on file  Lifestyle  . Physical activity:    Days per week: Not on file     Minutes per session: Not on file  . Stress: Not on file  Relationships  . Social connections:    Talks on phone: Not on file    Gets together: Not on file    Attends religious service: Not on file    Active member of club or organization: Not on file    Attends meetings of clubs or organizations: Not on file    Relationship status: Not on file  . Intimate partner violence:    Fear of current or ex partner: Not on file    Emotionally abused: Not on file    Physically abused: Not on file    Forced sexual activity: Not on file  Other Topics Concern  . Not on file  Social History Narrative  . Not on file   Family History  Problem Relation Age of Onset  . Cancer Mother     OBJECTIVE:  Vitals:   03/07/19 0954  BP: 128/82  Pulse: 98  Resp: 20  Temp: (!) 100.9 F (38.3 C)  TempSrc: Oral  SpO2: 100%     General appearance: alert; appears fatigued, but nontoxic, speaking in full sentences and managing own secretions HEENT: NCAT; Ears: EACs clear, TMs pearly gray with visible cone of light, without erythema; Eyes: PERRL, EOMI grossly; Nose: no obvious  rhinorrhea; Throat: oropharynx clear, tonsils 2+ RT>LT and erythematous without white tonsillar exudates, uvula midline Neck: supple without LAD Lungs: CTA bilaterally without adventitious breath sounds; cough absent Heart: regular rate and rhythm.  Radial pulses 2+ symmetrical bilaterally Skin: warm and dry Psychological: alert and cooperative; normal mood and affect  LABS: Results for orders placed or performed during the hospital encounter of 03/07/19 (from the past 24 hour(s))  POCT rapid strep A [facility])     Status: None   Collection Time: 03/07/19 10:12 AM  Result Value Ref Range   Streptococcus, Group A Screen (Direct) NEGATIVE NEGATIVE     ASSESSMENT & PLAN:  1. Acute pharyngitis, unspecified etiology     Meds ordered this encounter  Medications  . amoxicillin (AMOXIL) 500 MG capsule    Sig: Take 1  capsule (500 mg total) by mouth 2 (two) times daily for 10 days.    Dispense:  20 capsule    Refill:  0    Order Specific Question:   Supervising Provider    Answer:   Eustace Moore [[id number]]    You were not tested for COVID today Strep test negative, will send out for culture and we will call you with results However, based on symptoms and physical exam, I will treat you for potential strep Get plenty of rest and push fluids Drink warm or cool liquids, use throat lozenges, or popsicles to help alleviate symptoms Take OTC tylenol as needed for pain and fever You should remain isolated in your home for 7 days from symptom onset AND greater than 72 hours after symptoms resolution (absence of fever without the use of fever-reducing medication and improvement in respiratory symptoms), whichever is longer  Call or go to ER if patient has any new or worsening symptoms such as fever, chills, nausea, vomiting, worsening sore throat, cough, shortness of breath, abdominal pain, chest pain, chest pressure, changes in bowel or bladder habits, altered mental status, etc...   Reviewed expectations re: course of current medical issues. Questions answered. Outlined signs and symptoms indicating need for more acute intervention. Patient verbalized understanding. After Visit Summary given.        Rennis Harding, PA-C 03/07/19 1048

## 2019-03-09 LAB — CULTURE, GROUP A STREP (THRC)

## 2019-06-11 ENCOUNTER — Other Ambulatory Visit: Payer: Self-pay

## 2019-06-11 ENCOUNTER — Ambulatory Visit (HOSPITAL_COMMUNITY)
Admission: EM | Admit: 2019-06-11 | Discharge: 2019-06-11 | Disposition: A | Discharge status: Home / Self Care | Payer: Self-pay

## 2019-06-11 ENCOUNTER — Encounter (HOSPITAL_COMMUNITY): Payer: Self-pay | Admitting: Emergency Medicine

## 2019-06-11 DIAGNOSIS — R102 Pelvic and perineal pain: Secondary | ICD-10-CM

## 2019-06-11 MED ORDER — TRAMADOL HCL 50 MG PO TABS: 50.0000 mg | ORAL_TABLET | Freq: Four times a day (QID) | ORAL | 0 refills | Status: DC | PRN

## 2019-06-11 NOTE — ED Triage Notes (Signed)
Pt reports an abscess that developed last Wednesday near his rectum to the left.  He states he has tried epsom salts and warm compresses but it will not come to a head.  He tried more fiber and also tried some hemorrhoid cream with some relief.  He was also taking Tylenol and Ibuprofen for pain along with some left over Clindamycin.

## 2019-06-11 NOTE — ED Provider Notes (Signed)
[facility]    CSN: [id number] Arrival date & time: 06/11/19  0805     History   Chief Complaint Chief Complaint  Patient presents with   Abscess    HPI Shawn Molina is a 38 y.o. male no significant past medical history presenting today for evaluation of an abscess.  Patient states that he began to develop an abscess last Wednesday.  He has been trying Epsom salts and warm compresses without relief.  He is also tried increasing fiber intake as well as using a topical hemorrhoid cream with some improvement.  He is use Tylenol and ibuprofen for the pain and is also been taking some leftover clindamycin.  He notes that the majority of the pain is in his perineal area.  He denies any urinary symptoms of dysuria, increased frequency, urgency or penile discharge.  Denies hematuria.  Denies history of prostate issues.  Denies any anal intercourse.  Has previously had abscesses in this area.  Denies any fevers chills or body aches.  Denies concerns for constipation.  Bowel movements are regular, denies straining and state bowel movements are normal amount.  Denies blood in the stool or any drainage or leaking of fluids.  HPI  Past Medical History:  Diagnosis Date   H/O left knee surgery     There are no active problems to display for this patient.   Past Surgical History:  Procedure Laterality Date   KNEE SURGERY Left        Home Medications    Prior to Admission medications   Medication Sig Start Date End Date Taking? Authorizing Provider  acetaminophen (TYLENOL) 325 MG tablet Take 975 mg by mouth every 6 (six) hours as needed for moderate pain.   Yes [provider]  ibuprofen (ADVIL,MOTRIN) 800 MG tablet Take 1 tablet (800 mg total) by mouth 3 (three) times daily. 09/03/18  Yes Maczis, Elmer SowMichael M, PA-C  traMADol (ULTRAM) 50 MG tablet Take 1 tablet (50 mg total) by mouth every 6 (six) hours as needed for severe pain. 06/11/19   Neesa Knapik, Junius CreamerHallie C, PA-C     Family History Family History  Problem Relation Age of Onset   Cancer Mother     Social History Social History   Tobacco Use   Smoking status: Never Smoker   Smokeless tobacco: Never Used  Substance Use Topics   Alcohol use: Yes    Comment: social   Drug use: Yes    Types: Marijuana    Comment: occ     Allergies   Patient has no known allergies.   Review of Systems Review of Systems  Constitutional: Negative for fatigue and fever.  Eyes: Negative for redness, itching and visual disturbance.  Respiratory: Negative for shortness of breath.   Cardiovascular: Negative for chest pain and leg swelling.  Gastrointestinal: Positive for rectal pain. Negative for blood in stool, constipation, nausea and vomiting.  Musculoskeletal: Negative for arthralgias and myalgias.  Skin: Negative for color change, rash and wound.  Neurological: Negative for dizziness, syncope, weakness, light-headedness and headaches.     Physical Exam Triage Vital Signs ED Triage Vitals [06/11/19 0841]  Enc Vitals Group     BP      Pulse      Resp      Temp      Temp src      SpO2      Weight      Height      Head Circumference  Peak Flow      Pain Score 10     Pain Loc      Pain Edu?      Excl. in GC?    No data found.  Updated Vital Signs BP 126/84 (BP Location: Left Arm)    Pulse 93    Temp 98.9 F (37.2 C) (Oral)    Resp 12    SpO2 99%   Visual Acuity Right Eye Distance:   Left Eye Distance:   Bilateral Distance:    Right Eye Near:   Left Eye Near:    Bilateral Near:     Physical Exam Vitals signs and nursing note reviewed.  Constitutional:      Appearance: He is well-developed.  HENT:     Head: Normocephalic and atraumatic.  Eyes:     Conjunctiva/sclera: Conjunctivae normal.  Neck:     Musculoskeletal: Neck supple.  Cardiovascular:     Rate and Rhythm: Normal rate and regular rhythm.     Heart sounds: No murmur.  Pulmonary:     Effort: Pulmonary  effort is normal. No respiratory distress.     Breath sounds: Normal breath sounds.  Abdominal:     Palpations: Abdomen is soft.     Tenderness: There is no abdominal tenderness.  Genitourinary:    Comments: Everything no perirectal erythema or swelling, no obvious hemorrhoid, tenderness to palpation over perineum.  No overlying erythema, skin peeling or induration; tenderness does not extend to the scrotum  Mild discomfort with digital rectal exam Skin:    General: Skin is warm and dry.  Neurological:     Mental Status: He is alert.      UC Treatments / Results  Labs (all labs ordered are listed, but only abnormal results are displayed) Labs Reviewed - No data to display  EKG   Radiology No results found.  Procedures Procedures (including critical care time)  Medications Ordered in UC Medications - No data to display  Initial Impression / Assessment and Plan / UC Course  I have reviewed the triage vital signs and the nursing notes.  Pertinent labs & imaging results that were available during my care of the patient were reviewed by me and considered in my medical decision making (see chart for details).     Patient without any significant signs of superficial perirectal abscess, does have perineal pain.  Without any urinary symptoms, do not suspect prostatitis.  Given history of previous abscesses pain concerning for deeper abscess.  Recommended patient to follow-up in emergency room for potential CT scan to rule this out.  He has been on clindamycin, took 2 doses.  Patient expressed reluctance he to go, reinforced the importance of scanning with perirectal abscesses given concerns for potential fistulas.  Advised that if he does not go today continue the clindamycin, Tylenol and ibuprofen for pain.  Warm soaks, did provide tramadol for severe pain.  Advised that a low threshold to go to emergency room for scanning in the next 24 to 48 hours if any worsening or symptoms not  improving.  Discussed strict return precautions. Patient verbalized understanding and is agreeable with plan.  Final Clinical Impressions(s) / UC Diagnoses   Final diagnoses:  Perineal pain in male     Discharge Instructions     I recommend you being seen in the emergency room for scanning to check for perirectal abscess  If you decide not to go today- please have a low treshold to go if not seeing any  improvement or worsening in the next 24-48 hours Continue warm compresses, soak in warm bath Use anti-inflammatories for pain/swelling. You may take up to 800 mg Ibuprofen every 8 hours with food. You may supplement Ibuprofen with Tylenol [phone] mg every 8 hours.  Tramadol for severe pain- do not drive or work after taking May continue previously prescribed clindamycin every 8 hours    ED Prescriptions    Medication Sig Dispense Auth. Provider   traMADol (ULTRAM) 50 MG tablet Take 1 tablet (50 mg total) by mouth every 6 (six) hours as needed for severe pain. 10 tablet Jeffey Janssen, Murray C, PA-C     Controlled Substance Prescriptions [place] Controlled Substance Registry consulted? Yes, I have consulted the [place] Controlled Substances Registry for this patient, and feel the risk/benefit ratio today is favorable for proceeding with this prescription for a controlled substance.   Joneen Caraway Mulford C, [place] 06/11/19 [phone]

## 2019-06-11 NOTE — Discharge Instructions (Signed)
I recommend you being seen in the emergency room for scanning to check for perirectal abscess  If you decide not to go today- please have a low treshold to go if not seeing any improvement or worsening in the next 24-48 hours Continue warm compresses, soak in warm bath Use anti-inflammatories for pain/swelling. You may take up to 800 mg Ibuprofen every 8 hours with food. You may supplement Ibuprofen with Tylenol [phone] mg every 8 hours.  Tramadol for severe pain- do not drive or work after taking May continue previously prescribed clindamycin every 8 hours

## 2019-06-14 ENCOUNTER — Emergency Department (HOSPITAL_COMMUNITY)
Admission: EM | Admit: 2019-06-14 | Discharge: 2019-06-14 | Disposition: A | Discharge status: Home / Self Care | Payer: Self-pay | Attending: Emergency Medicine | Admitting: Emergency Medicine

## 2019-06-14 ENCOUNTER — Other Ambulatory Visit: Payer: Self-pay

## 2019-06-14 ENCOUNTER — Encounter (HOSPITAL_COMMUNITY): Payer: Self-pay | Admitting: Emergency Medicine

## 2019-06-14 ENCOUNTER — Emergency Department (HOSPITAL_COMMUNITY): Payer: Self-pay

## 2019-06-14 DIAGNOSIS — Z20828 Contact with and (suspected) exposure to other viral communicable diseases: Secondary | ICD-10-CM | POA: Insufficient documentation

## 2019-06-14 DIAGNOSIS — K611 Rectal abscess: Secondary | ICD-10-CM | POA: Insufficient documentation

## 2019-06-14 LAB — CBC WITH DIFFERENTIAL/PLATELET
Abs Immature Granulocytes: 0.06 10*3/uL (ref 0.00–0.07)
Basophils Absolute: 0 10*3/uL (ref 0.0–0.1)
Basophils Relative: 0 %
Eosinophils Absolute: 0 10*3/uL (ref 0.0–0.5)
Eosinophils Relative: 0 %
HCT: 40.4 % (ref 39.0–52.0)
Hemoglobin: 13.3 g/dL (ref 13.0–17.0)
Immature Granulocytes: 0 %
Lymphocytes Relative: 15 %
Lymphs Abs: 2.1 10*3/uL (ref 0.7–4.0)
MCH: 28.6 pg (ref 26.0–34.0)
MCHC: 32.9 g/dL (ref 30.0–36.0)
MCV: 86.9 fL (ref 80.0–100.0)
Monocytes Absolute: 1.1 10*3/uL — ABNORMAL HIGH (ref 0.1–1.0)
Monocytes Relative: 8 %
Neutro Abs: 10.4 10*3/uL — ABNORMAL HIGH (ref 1.7–7.7)
Neutrophils Relative %: 77 %
Platelets: 338 10*3/uL (ref 150–400)
RBC: 4.65 MIL/uL (ref 4.22–5.81)
RDW: 14.4 % (ref 11.5–15.5)
WBC: 13.7 10*3/uL — ABNORMAL HIGH (ref 4.0–10.5)
nRBC: 0 % (ref 0.0–0.2)

## 2019-06-14 LAB — SARS CORONAVIRUS 2 BY RT PCR (HOSPITAL ORDER, PERFORMED IN ~~LOC~~ HOSPITAL LAB): SARS Coronavirus 2: NEGATIVE

## 2019-06-14 LAB — URINALYSIS, ROUTINE W REFLEX MICROSCOPIC
Glucose, UA: NEGATIVE mg/dL
Hgb urine dipstick: NEGATIVE
Ketones, ur: 5 mg/dL — AB
Leukocytes,Ua: NEGATIVE
Nitrite: NEGATIVE
Protein, ur: 100 mg/dL — AB
Specific Gravity, Urine: >1.046 — ABNORMAL HIGH (ref 1.005–1.030)
pH: 5 (ref 5.0–8.0)

## 2019-06-14 LAB — COMPREHENSIVE METABOLIC PANEL
ALT: 61 U/L — ABNORMAL HIGH (ref 0–44)
AST: 47 U/L — ABNORMAL HIGH (ref 15–41)
Albumin: 3.4 g/dL — ABNORMAL LOW (ref 3.5–5.0)
Alkaline Phosphatase: 80 U/L (ref 38–126)
Anion gap: 12 (ref 5–15)
BUN: 12 mg/dL (ref 6–20)
CO2: 25 mmol/L (ref 22–32)
Calcium: 9.3 mg/dL (ref 8.9–10.3)
Chloride: 98 mmol/L (ref 98–111)
Creatinine, Ser: 1.07 mg/dL (ref 0.61–1.24)
GFR calc Af Amer: >60 mL/min (ref >60)
GFR calc non Af Amer: >60 mL/min (ref >60)
Glucose, Bld: 116 mg/dL — ABNORMAL HIGH (ref 70–99)
Potassium: 4.3 mmol/L (ref 3.5–5.1)
Sodium: 135 mmol/L (ref 135–145)
Total Bilirubin: 0.8 mg/dL (ref 0.3–1.2)
Total Protein: 8.1 g/dL (ref 6.5–8.1)

## 2019-06-14 LAB — LACTIC ACID, PLASMA: Lactic Acid, Venous: 1 mmol/L (ref 0.5–1.9)

## 2019-06-14 MED ORDER — ONDANSETRON HCL 4 MG/2ML IJ SOLN
4.0000 mg | Freq: Once | INTRAMUSCULAR | Status: AC
  Administered 2019-06-14: 4 mg via INTRAVENOUS
  Filled 2019-06-14: qty 2

## 2019-06-14 MED ORDER — METRONIDAZOLE IN NACL 5-0.79 MG/ML-% IV SOLN
500.0000 mg | Freq: Once | INTRAVENOUS | Status: AC
  Administered 2019-06-14: 12:00:00 500 mg via INTRAVENOUS
  Filled 2019-06-14: qty 100

## 2019-06-14 MED ORDER — VANCOMYCIN HCL 10 G IV SOLR
1500.0000 mg | Freq: Once | INTRAVENOUS | Status: DC
  Filled 2019-06-14: qty 1500

## 2019-06-14 MED ORDER — FENTANYL CITRATE (PF) 100 MCG/2ML IJ SOLN
50.0000 ug | Freq: Once | INTRAMUSCULAR | Status: AC
  Administered 2019-06-14: 50 ug via INTRAVENOUS
  Filled 2019-06-14: qty 2

## 2019-06-14 MED ORDER — AMOXICILLIN-POT CLAVULANATE 875-125 MG PO TABS: 1.0000 | ORAL_TABLET | Freq: Two times a day (BID) | ORAL | 0 refills | Status: AC

## 2019-06-14 MED ORDER — IOPAMIDOL (ISOVUE-300) INJECTION 61%
100.0000 mL | Freq: Once | INTRAVENOUS | Status: AC | PRN
  Administered 2019-06-14: 100 mL via INTRAVENOUS

## 2019-06-14 MED ORDER — SODIUM CHLORIDE 0.9 % IV SOLN
1.0000 g | Freq: Once | INTRAVENOUS | Status: AC
  Administered 2019-06-14: 1 g via INTRAVENOUS
  Filled 2019-06-14: qty 10

## 2019-06-14 MED ORDER — LIDOCAINE HCL (PF) 1 % IJ SOLN
INTRAMUSCULAR | Status: AC
  Filled 2019-06-14: qty 5

## 2019-06-14 MED ORDER — AMOXICILLIN-POT CLAVULANATE 875-125 MG PO TABS: 1.0000 | ORAL_TABLET | Freq: Two times a day (BID) | ORAL | 0 refills | Status: DC

## 2019-06-14 MED ORDER — SODIUM CHLORIDE 0.9 % IV SOLN
Freq: Once | INTRAVENOUS | Status: AC
  Administered 2019-06-14: 06:00:00 via INTRAVENOUS

## 2019-06-14 MED ORDER — MORPHINE SULFATE (PF) 4 MG/ML IV SOLN
4.0000 mg | Freq: Once | INTRAVENOUS | Status: AC
  Administered 2019-06-14: 4 mg via INTRAVENOUS
  Filled 2019-06-14: qty 1

## 2019-06-14 NOTE — ED Notes (Signed)
Per Surgery patient does not need IV vancomycin and flagyl does not need to be finished. Patient may be discharged.

## 2019-06-14 NOTE — ED Triage Notes (Signed)
Patient with abscess in perineum, states he went to [facility] and had it I&D yesterday.  Now with increased pain, states he can not sit down for long periods of time, has been taking tylenol but it is not helping with the pain.  Patient is diaphoretic in triage.

## 2019-06-14 NOTE — ED Provider Notes (Signed)
[place] EMERGENCY DEPARTMENT Provider Note   CSN: [id number] Arrival date & time: 06/14/19  0542    History   Chief Complaint Chief Complaint  Patient presents with   Rectal Pain    HPI Shawn Molina is a 38 y.o. male presents to the ER for evaluation of persistent, worsening, severe pain in the perineum.  Onset 1 week ago.  Associated with redness, swelling, warmth.  He has been to urgent care twice.  On 7/6 he was seen in urgent care and was discharged with tramadol.  Again return to urgent care on 7/8 where he was diagnosed with a perianal abscess and had I&D with packing.  He was not given any prescription for antibiotics.  He has been taking tramadol, Tylenol and doing warm sits baths without relief.  He has pain with sitting, moving, closing his legs.  He had a bowel movement but did not have pain with passing stool, blood, pus.  He denies any fevers, chills, malaise.  Has had perianal abscess in the past but never had to get it incised.  No alleviating factors.      HPI  Past Medical History:  Diagnosis Date   H/O left knee surgery     There are no active problems to display for this patient.   Past Surgical History:  Procedure Laterality Date   KNEE SURGERY Left         Home Medications    Prior to Admission medications   Medication Sig Start Date End Date Taking? Authorizing Provider  acetaminophen (TYLENOL) 325 MG tablet Take 975 mg by mouth every 6 (six) hours as needed for moderate pain.   Yes [provider]  traMADol (ULTRAM) 50 MG tablet Take 1 tablet (50 mg total) by mouth every 6 (six) hours as needed for severe pain. 06/11/19  Yes Wieters, Hallie C, PA-C  amoxicillin-clavulanate (AUGMENTIN) 875-125 MG tablet Take 1 tablet by mouth 2 (two) times daily for 5 days. 06/14/19 06/19/19  Meuth, Blaine Hamper, PA-C    Family History Family History  Problem Relation Age of Onset   Cancer Mother     Social History Social  History   Tobacco Use   Smoking status: Never Smoker   Smokeless tobacco: Never Used  Substance Use Topics   Alcohol use: Yes    Comment: social   Drug use: Yes    Types: Marijuana    Comment: occ     Allergies   Patient has no known allergies.   Review of Systems Review of Systems  Skin:       Abscess   All other systems reviewed and are negative.    Physical Exam Updated Vital Signs BP 125/83    Pulse (!) 107    Temp 97.9 F (36.6 C) (Oral)    SpO2 100%   Physical Exam Vitals signs and nursing note reviewed.  Constitutional:      General: He is not in acute distress.    Appearance: He is well-developed.     Comments: NAD.  HENT:     Head: Normocephalic and atraumatic.     Right Ear: External ear normal.     Left Ear: External ear normal.     Nose: Nose normal.  Eyes:     General: No scleral icterus.    Conjunctiva/sclera: Conjunctivae normal.  Neck:     Musculoskeletal: Normal range of motion and neck supple.  Cardiovascular:     Rate and Rhythm: Normal rate  and regular rhythm.     Heart sounds: No murmur.  Pulmonary:     Effort: Pulmonary effort is normal.     Breath sounds: Normal breath sounds.  Genitourinary:    Comments: Area of fluctuance, firmness, tenderness, warmth and erythema to left of perineum extending into base of scrotum and inferior anal verge. Pt declined DRE states [facility]. Incision from I&D is small, approximately 4 cm of packing removed. No bleeding, purulence through incision. Scrotum is non tender  Musculoskeletal: Normal range of motion.        General: No deformity.  Skin:    General: Skin is warm and dry.     Capillary Refill: Capillary refill takes less than 2 seconds.     Findings: Abscess present.  Neurological:     Mental Status: He is alert and oriented to person, place, and time.  Psychiatric:        Behavior: Behavior normal.        Thought Content: Thought content normal.        Judgment:  Judgment normal.      ED Treatments / Results  Labs (all labs ordered are listed, but only abnormal results are displayed) Labs Reviewed  CBC WITH DIFFERENTIAL/PLATELET - Abnormal; Notable for the following components:      Result Value   WBC 13.7 (*)    Neutro Abs 10.4 (*)    Monocytes Absolute 1.1 (*)    All other components within normal limits  COMPREHENSIVE METABOLIC PANEL - Abnormal; Notable for the following components:   Glucose, Bld 116 (*)    Albumin 3.4 (*)    AST 47 (*)    ALT 61 (*)    All other components within normal limits  URINALYSIS, ROUTINE W REFLEX MICROSCOPIC - Abnormal; Notable for the following components:   Color, Urine AMBER (*)    Specific Gravity, Urine >1.046 (*)    Bilirubin Urine SMALL (*)    Ketones, ur 5 (*)    Protein, ur 100 (*)    Bacteria, UA FEW (*)    All other components within normal limits  SARS CORONAVIRUS 2 (HOSPITAL ORDER, PERFORMED IN [facility])  CULTURE, BLOOD (ROUTINE X 2)  CULTURE, BLOOD (ROUTINE X 2)  AEROBIC CULTURE (SUPERFICIAL SPECIMEN)  LACTIC ACID, PLASMA  LACTIC ACID, PLASMA    EKG None  Radiology Ct Pelvis W Contrast  Result Date: 06/14/2019 CLINICAL DATA:  Status post surgery yesterday for a perirectal or perianal abscess with incision and drainage. Worsening swelling. EXAM: CT PELVIS WITH CONTRAST TECHNIQUE: Multidetector CT imaging of the pelvis was performed using the standard protocol following the bolus administration of intravenous contrast. CONTRAST:  100mL ISOVUE-300 IOPAMIDOL (ISOVUE-300) INJECTION 61% COMPARISON:  None. FINDINGS: Urinary Tract:  No abnormality visualized. Bowel: Normal appearing pelvic loops of colon and small bowel. Normal appearing appendix. Vascular/Lymphatic: No pathologically enlarged lymph nodes. No significant vascular abnormality seen. Reproductive:  Normal appearing prostate gland. Other: Oval fluid collection with mild peripheral rim enhancement in the perianal and  distal perirectal region, measuring 8.5 x 3.8 cm on axial image number 61 and 6.0 cm in length on sagittal image number 108. Musculoskeletal: Unremarkable bones. IMPRESSION: 8.5 x 3.8 x 6.0 cm perianal and distal perirectal abscess. Electronically Signed   By: Beckie SaltsSteven  Reid M.D.   On: 06/14/2019 09:44    Procedures Procedures (including critical care time)  Medications Ordered in ED Medications  metroNIDAZOLE (FLAGYL) IVPB 500 mg (500  mg Intravenous New Bag/Given 06/14/19 1131)  vancomycin (VANCOCIN) 1,500 mg in sodium chloride 0.9 % 500 mL IVPB (has no administration in time range)  lidocaine (PF) (XYLOCAINE) 1 % injection (has no administration in time range)  fentaNYL (SUBLIMAZE) injection 50 mcg (50 mcg Intravenous Given 06/14/19 0622)  ondansetron (ZOFRAN) injection 4 mg (4 mg Intravenous Given 06/14/19 0621)  0.9 %  sodium chloride infusion ( Intravenous Stopped 06/14/19 0844)  morphine 4 MG/ML injection 4 mg (4 mg Intravenous Given 06/14/19 0842)  cefTRIAXone (ROCEPHIN) 1 g in sodium chloride 0.9 % 100 mL IVPB (0 g Intravenous Stopped 06/14/19 1039)  iopamidol (ISOVUE-300) 61 % injection 100 mL (100 mLs Intravenous Contrast Given 06/14/19 0935)     Initial Impression / Assessment and Plan / ED Course  I have reviewed the triage vital signs and the nursing notes.  Pertinent labs & imaging results that were available during my care of the patient were reviewed by me and considered in my medical decision making (see chart for details).  Clinical Course as of Jun 13 1149  Thu Jun 14, 2019  0846 Temp: 97.9 F (36.6 C) [CG]  0847 Pulse Rate(!): 107 [CG]  0847 WBC(!): 13.7 [CG]  0847 Lactic Acid, Venous: 1.0 [CG]  1004 IMPRESSION: 8.5 x 3.8 x 6.0 cm perianal and distal perirectal abscess.  CT PELVIS W CONTRAST [CG]  1008 Spoke to [place] with general surgery who will review CT scan and come see patient to determine best course of action regarding I&D    [CG]  1121 Surgery PA at bedside doing  procedure now    [CG]    Clinical Course User Index [CG] Liberty HandyGibbons, Fate Caster J, PA-C    DDX includes subcutaneous abscess versus deeper perirectal pelvic abscess.  He is mildly tachycardic and exam reveals large area of fluctuance and firmness almost at the inferior anal verge.  He declined DRE.  Lab work and imaging reviewed and interpreted by me and radiologist.  WBC 13.7, no lactic acidosis.  CT pelvis reveals 9 x 4 x 6 cm perianal and perirectal abscess.  I consulted general surgery who evaluated patient in the ER emergently and did bedside I&D with packing.  Blood cultures and wound culture sent.  Patient received IV antibiotics per pharmacy consult and recommendation.  Per general surgery he is appropriate for discharge with Augmentin and clinic follow-up.  Return precautions given.  Patient is comfortable with this plan.  Final Clinical Impressions(s) / ED Diagnoses   Final diagnoses:  Perirectal abscess    ED Discharge Orders         Ordered    amoxicillin-clavulanate (AUGMENTIN) 875-125 MG tablet  2 times daily     06/14/19 1143    amoxicillin-clavulanate (AUGMENTIN) 875-125 MG tablet  Every 12 hours,   Status:  Discontinued     06/14/19 1148           Liberty HandyGibbons, Rylen Swindler J, [place]PA-C 06/14/19 1150    Lorre NickAllen, Anthony, MD 06/20/19 [phone]

## 2019-06-14 NOTE — ED Notes (Signed)
Pt given discharge instructions, prescription information, follow up information and the opportunity to ask questions. Pt verbalized understanding.

## 2019-06-14 NOTE — Discharge Instructions (Signed)
Change dry gauze as needed for saturation or if you need to go to the bathroom The wound will likely continue to drain for a few days Remove packing on the evening of 06/15/2019. Recommend getting in the shower to get the packing wet, it will come out easier After packing is removed recommend 1-3 times daily shower and tub soaks Take antibiotics as prescribed Call with any concerns (ie fever, chills, increased pain at wound site) Follow up in about 1 week   Anorectal Abscess An abscess is an infected area that contains a collection of pus. An anorectal abscess is an abscess that is near the opening of the anus or around the rectum. Without treatment, an anorectal abscess can become larger and cause other problems, such as a more serious body-wide infection or pain, especially during bowel movements. What are the causes? This condition is caused by plugged glands or an infection in one of these areas: The anus. The area between the anus and the scrotum in males or between the anus and the vagina in females (perineum). What increases the risk? The following factors may make you more likely to develop this condition: Diabetes or inflammatory bowel disease. Having a body defense system (immune system) that is weak. Engaging in anal sex. Having a sexually transmitted infection (STI). Certain kinds of cancer, such as rectal carcinoma, leukemia, or lymphoma. What are the signs or symptoms? The main symptom of this condition is pain. The pain may be a throbbing pain that gets worse during bowel movements. Other symptoms include: Swelling and redness in the area of the abscess. The redness may go beyond the abscess and appear as a red streak on the skin. A visible, painful lump, or a lump that can be felt when touched. Bleeding or pus-like discharge from the area. Fever. General weakness. Constipation. Diarrhea. How is this diagnosed? This condition is diagnosed based on your medical history  and a physical exam of the affected area. This may involve examining the rectal area with a gloved hand (digital rectal exam). Sometimes, the health care provider needs to look into the rectum using a probe, scope, or imaging test. For women, it may require a careful vaginal exam. How is this treated? Treatment for this condition may include: Incision and drainage surgery. This involves making an incision over the abscess to drain the pus. Medicines, including antibiotic medicine, pain medicine, stool softeners, or laxatives. Follow these instructions at home: Medicines Take over-the-counter and prescription medicines only as told by your health care provider. If you were prescribed an antibiotic medicine, use it as told by your health care provider. Do not stop using the antibiotic even if you start to feel better. Do not drive or use heavy machinery while taking prescription pain medicine. Wound care  If gauze was used in the abscess, follow instructions from your health care provider about removing or changing the gauze. It can usually be removed in 2-3 days. Wash your hands with soap and water before you remove or change your gauze. If soap and water are not available, use hand sanitizer. If one or more drains were placed in the abscess cavity, be careful not to pull at them. Your health care provider will tell you how long they need to remain in place. Check your incision area every day for signs of infection. Check for: More redness, swelling, or pain. More fluid or blood. Warmth. Pus or a bad smell. Managing pain, stiffness, and swelling  Take a sitz bath 3-4  times a day and after bowel movements. This will help reduce pain and swelling. To relieve pain, try sitting: On a heating pad with the setting on low. On an inflatable donut-shaped cushion. If directed, put ice on the affected area: Put ice in a plastic bag. Place a towel between your skin and the bag. Leave the ice on  for 20 minutes, 2-3 times a day. General instructions Follow any diet instructions given by your health care provider. Keep all follow-up visits as told by your health care provider. This is important. Contact a health care provider if you have: Bleeding from your incision. Pain, swelling, or redness that does not improve or gets worse. Trouble passing stool or urine. Symptoms that return after treatment. Get help right away if you: Have problems moving or using your legs. Have severe or increasing pain. Have swelling in the affected area that suddenly gets worse. Have a large increase in bleeding or passing of pus. Develop chills or a fever. Summary An anorectal abscess is an abscess that is near the opening of the anus or around the rectum. An abscess is an infected area that contains a collection of pus. The main symptom of this condition is pain. It may be a throbbing pain that gets worse during bowel movements. Treatment for an anorectal abscess may include surgery to drain the pus from the abscess. Medicines and sitz baths may also be a part of your treatment plan. This information is not intended to replace advice given to you by your health care provider. Make sure you discuss any questions you have with your health care provider. Document Released: 11/19/2000 Document Revised: 12/29/2017 Document Reviewed: 12/29/2017 Elsevier Patient Education  2020 [employer]

## 2019-06-14 NOTE — Consult Note (Signed)
[facility] Surgery Consult Note  Shawn Molina [birth year]  [id number].    Requesting MD: Lacretia Leigh Chief Complaint/Reason for Consult: perirectal abscess  HPI:  Shawn Molina is a 38yo male who presented to [facility] earlier today complaining of persistent perirectal pain.  States that he started having pain 1 week ago. He went to urgent care and was sent home on tramadol. He went back to urgent care yesterday and was found to have a perirectal abscess and bedside I&D was performed. Wound was packed. He was not discharged on antibiotics. Patient states that his pain did not improve so he came to the ED today. Pain is worse with sitting. Denies fever or chills. CT scan shows 8.5 x 3.8 x 6.0 cm perianal and distal perirectal abscess. General surgery asked to see.  No significant PMH Does not smoke tobacco Employment: welder  ROS: Review of Systems  Constitutional: Negative.  Negative for chills and fever.  HENT: Negative.   Eyes: Negative.   Respiratory: Negative.   Cardiovascular: Negative.   Gastrointestinal: Negative.        Perirectal pain  Genitourinary: Negative.   Musculoskeletal: Negative.   Skin: Negative.   Neurological: Negative.    All systems reviewed and otherwise negative except for as above  Family History  Problem Relation Age of Onset  . Cancer Mother     Past Medical History:  Diagnosis Date  . H/O left knee surgery     Past Surgical History:  Procedure Laterality Date  . KNEE SURGERY Left     Social History:  reports that he has never smoked. He has never used smokeless tobacco. He reports current alcohol use. He reports current drug use. Drug: Marijuana.  Allergies: No Known Allergies  (Not in a hospital admission)   Prior to Admission medications   Medication Sig Start Date End Date Taking? Authorizing Provider  acetaminophen (TYLENOL) 325 MG tablet Take 975 mg by mouth every 6 (six) hours as needed for moderate pain.   Yes  [provider]  traMADol (ULTRAM) 50 MG tablet Take 1 tablet (50 mg total) by mouth every 6 (six) hours as needed for severe pain. 06/11/19  Yes Wieters, Hallie C, PA-C  amoxicillin-clavulanate (AUGMENTIN) 875-125 MG tablet Take 1 tablet by mouth 2 (two) times daily for 5 days. 06/14/19 06/19/19  Meuth, Blaine Hamper, PA-C    Blood pressure 125/83, pulse (!) 107, temperature 97.9 F (36.6 C), temperature source Oral, SpO2 100 %. Physical Exam: General: pleasant, WD/WN AA male who is laying in bed in NAD HEENT: head is normocephalic, atraumatic.  Sclera are noninjected.  Pupils equal and round.  Ears and nose without any masses or lesions.  Mouth is pink and moist. Dentition fair Heart: regular, rate, and rhythm.  No obvious murmurs, gallops, or rubs noted.  Palpable pedal pulses bilaterally Lungs: CTAB, no wheezes, rhonchi, or rales noted.  Respiratory effort nonlabored Abd: soft, NT/ND, +BS, no masses, hernias, or organomegaly MS: all 4 extremities are symmetrical with no cyanosis, clubbing, or edema. Skin: warm and dry with no masses, lesions, or rashes Psych: A&Ox3 with an appropriate affect. Neuro: cranial nerves grossly intact, extremity CSM intact bilaterally, normal speech GU: large, fluctuant left perirectal abscess with prior small incision, no active drainage, the area is very tender  Results for orders placed or performed during the hospital encounter of 06/14/19 (from the past 48 hour(s))  CBC with Differential     Status: Abnormal   Collection Time: 06/14/19  8:00 AM  Result Value Ref Range   WBC 13.7 (H) 4.0 - 10.5 K/uL   RBC 4.65 4.22 - 5.81 MIL/uL   Hemoglobin 13.3 13.0 - 17.0 g/dL   HCT 40.140.4 02.739.0 - 25.352.0 %   MCV 86.9 80.0 - 100.0 fL   MCH 28.6 26.0 - 34.0 pg   MCHC 32.9 30.0 - 36.0 g/dL   RDW 66.414.4 40.311.5 - 47.415.5 %   Platelets 338 150 - 400 K/uL   nRBC 0.0 0.0 - 0.2 %   Neutrophils Relative % 77 %   Neutro Abs 10.4 (H) 1.7 - 7.7 K/uL   Lymphocytes Relative 15 %    Lymphs Abs 2.1 0.7 - 4.0 K/uL   Monocytes Relative 8 %   Monocytes Absolute 1.1 (H) 0.1 - 1.0 K/uL   Eosinophils Relative 0 %   Eosinophils Absolute 0.0 0.0 - 0.5 K/uL   Basophils Relative 0 %   Basophils Absolute 0.0 0.0 - 0.1 K/uL   Immature Granulocytes 0 %   Abs Immature Granulocytes 0.06 0.00 - 0.07 K/uL    Comment: Performed at [facility], [address] [place], [place] [address]  Lactic acid, plasma     Status: None   Collection Time: 06/14/19  8:00 AM  Result Value Ref Range   Lactic Acid, Venous 1.0 0.5 - 1.9 mmol/L    Comment: Performed at [facility], [address] [place], [place] [address]  Comprehensive metabolic panel     Status: Abnormal   Collection Time: 06/14/19  8:00 AM  Result Value Ref Range   Sodium 135 135 - 145 mmol/L   Potassium 4.3 3.5 - 5.1 mmol/L   Chloride 98 98 - 111 mmol/L   CO2 25 22 - 32 mmol/L   Glucose, Bld 116 (H) 70 - 99 mg/dL   BUN 12 6 - 20 mg/dL   Creatinine, Ser 4.331.07 0.61 - 1.24 mg/dL   Calcium 9.3 8.9 - 29.510.3 mg/dL   Total Protein 8.1 6.5 - 8.1 g/dL   Albumin 3.4 (L) 3.5 - 5.0 g/dL   AST 47 (H) 15 - 41 U/L   ALT 61 (H) 0 - 44 U/L   Alkaline Phosphatase 80 38 - 126 U/L   Total Bilirubin 0.8 0.3 - 1.2 mg/dL   GFR calc non Af Amer >60 >60 mL/min   GFR calc Af Amer >60 >60 mL/min   Anion gap 12 5 - 15    Comment: Performed at [facility], [address] [place], [place] [address]  Urinalysis, Routine w reflex microscopic     Status: Abnormal   Collection Time: 06/14/19  8:00 AM  Result Value Ref Range   Color, Urine AMBER (A) YELLOW    Comment: BIOCHEMICALS MAY BE AFFECTED BY COLOR   APPearance CLEAR CLEAR   Specific Gravity, Urine >1.046 (H) 1.005 - 1.030   pH 5.0 5.0 - 8.0   Glucose, UA NEGATIVE NEGATIVE mg/dL   Hgb urine dipstick NEGATIVE NEGATIVE   Bilirubin Urine SMALL (A) NEGATIVE   Ketones, ur 5 (A) NEGATIVE mg/dL   Protein, ur 660100 (A) NEGATIVE mg/dL   Nitrite NEGATIVE NEGATIVE   Leukocytes,Ua  NEGATIVE NEGATIVE   RBC / HPF 0-5 0 - 5 RBC/hpf   WBC, UA 0-5 0 - 5 WBC/hpf   Bacteria, UA FEW (A) NONE SEEN   Mucus PRESENT     Comment: Performed at [facility], [address] [place], [place] [address]  Ct Pelvis W Contrast  Result Date: 06/14/2019 CLINICAL DATA:  Status post surgery yesterday for a perirectal or perianal abscess with incision and drainage. Worsening swelling. EXAM: CT PELVIS WITH CONTRAST TECHNIQUE: Multidetector CT imaging of the pelvis was performed using the standard protocol following the bolus administration of intravenous contrast. CONTRAST:  100mL ISOVUE-300 IOPAMIDOL (ISOVUE-300) INJECTION 61% COMPARISON:  None. FINDINGS: Urinary Tract:  No abnormality visualized. Bowel: Normal appearing pelvic loops of colon and small bowel. Normal appearing appendix. Vascular/Lymphatic: No pathologically enlarged lymph nodes. No significant vascular abnormality seen. Reproductive:  Normal appearing prostate gland. Other: Oval fluid collection with mild peripheral rim enhancement in the perianal and distal perirectal region, measuring 8.5 x 3.8 cm on axial image number 61 and 6.0 cm in length on sagittal image number 108. Musculoskeletal: Unremarkable bones. IMPRESSION: 8.5 x 3.8 x 6.0 cm perianal and distal perirectal abscess. Electronically Signed   By: Beckie SaltsSteven  Reid M.D.   On: 06/14/2019 09:44      Assessment/Plan Perirectal abscess - Bedside I&D performed (procedure note below). Wound packed with iodoform gauze. Patient advised to remove packing tomorrow and start sitz baths. Culture sent. Will send 5 day rx of augmentin to his pharmacy. Follow up in our office in about 1 week.  Franne FortsBrooke A Meuth, PA-C [facility] 06/14/2019, 11:46 AM Pager: [phone] Mon-Thurs 7:00 am-4:30 pm Fri 7:00 am -11:30 AM Sat-Sun 7:00 am-11:30 am    Incision and Drainage Procedure Note  Pre-operative Diagnosis: Perirectal abscess  Post-operative Diagnosis:  same  Indications: pain, infection  Anesthesia: 1% plain lidocaine  Procedure Details  The procedure, risks and complications have been discussed in detail (including, but not limited to pain, infection, bleeding, need for more procedures in the future) with the patient, and the patient consented to the procedure.  The skin was sterilely prepped and draped over the affected area in the usual fashion. After adequate local anesthesia, 2cm elliptical incision made with a #11 blade  on the left perirectal abscess. Copious purulent drainage: present. Would irrigated with saline and packed with iodoform gauze. Dry dressing applied. The patient was observed until stable.  Findings: Copious purulent fluid, sent for culture  EBL: minimal  Drains: none  Condition: Tolerated procedure well and Stable   Complications: none.

## 2019-06-14 NOTE — ED Notes (Signed)
Patient transported to CT 

## 2019-06-17 ENCOUNTER — Other Ambulatory Visit: Payer: Self-pay

## 2019-06-17 ENCOUNTER — Encounter (HOSPITAL_COMMUNITY): Payer: Self-pay | Admitting: Emergency Medicine

## 2019-06-17 ENCOUNTER — Emergency Department (HOSPITAL_COMMUNITY)
Admission: EM | Admit: 2019-06-17 | Discharge: 2019-06-17 | Disposition: A | Discharge status: Home / Self Care | Payer: Self-pay | Attending: Emergency Medicine | Admitting: Emergency Medicine

## 2019-06-17 DIAGNOSIS — K611 Rectal abscess: Secondary | ICD-10-CM | POA: Insufficient documentation

## 2019-06-17 DIAGNOSIS — Z4800 Encounter for change or removal of nonsurgical wound dressing: Secondary | ICD-10-CM

## 2019-06-17 DIAGNOSIS — Z4801 Encounter for change or removal of surgical wound dressing: Secondary | ICD-10-CM | POA: Insufficient documentation

## 2019-06-17 DIAGNOSIS — Z79899 Other long term (current) drug therapy: Secondary | ICD-10-CM | POA: Insufficient documentation

## 2019-06-17 LAB — AEROBIC CULTURE W GRAM STAIN (SUPERFICIAL SPECIMEN)

## 2019-06-17 NOTE — ED Notes (Signed)
Pt states has removed some packing since placed on Friday and is nervous about pulling the rest out.  No pain and is draining pink color drainage. Pt provided warm blankets and comfortable position

## 2019-06-17 NOTE — ED Provider Notes (Signed)
[facility] EMERGENCY DEPARTMENT Provider Note   CSN: [id number] Arrival date & time: 06/17/19  0556     History   Chief Complaint Chief Complaint  Patient presents with  . Abscess    HPI Shawn Molina is a 38 y.o. male presenting for packing removal.  Patient states he has been having issues with perirectal abscess.  It was drained in the ER on the 9th, 3 days ago, by surgery.  He has been doing well since then.  He was posterior in the packing, was able to do so as it was stuck and tender.  He denies fevers, chills, increasing pain, increasing pus.  He was not given instructions to follow-up with general surgery at any specific time of the symptoms worsen.  Patient states he has no medical problems, takes medications daily.     HPI  Past Medical History:  Diagnosis Date  . H/O left knee surgery     There are no active problems to display for this patient.   Past Surgical History:  Procedure Laterality Date  . KNEE SURGERY Left         Home Medications    Prior to Admission medications   Medication Sig Start Date End Date Taking? Authorizing Provider  acetaminophen (TYLENOL) 325 MG tablet Take 975 mg by mouth every 6 (six) hours as needed for moderate pain.    [provider]  amoxicillin-clavulanate (AUGMENTIN) 875-125 MG tablet Take 1 tablet by mouth 2 (two) times daily for 5 days. 06/14/19 06/19/19  Meuth, Blaine Hamper, PA-C  traMADol (ULTRAM) 50 MG tablet Take 1 tablet (50 mg total) by mouth every 6 (six) hours as needed for severe pain. 06/11/19   Wieters, Elesa Hacker, PA-C    Family History Family History  Problem Relation Age of Onset  . Cancer Mother     Social History Social History   Tobacco Use  . Smoking status: Never Smoker  . Smokeless tobacco: Never Used  Substance Use Topics  . Alcohol use: Yes    Comment: social  . Drug use: Yes    Types: Marijuana    Comment: occ     Allergies   Patient has no known  allergies.   Review of Systems Review of Systems  Constitutional: Negative for fever.  Gastrointestinal: Negative for abdominal pain.     Physical Exam Updated Vital Signs BP 125/88   Pulse 75   Temp 97.9 F (36.6 C) (Oral)   Resp 18   Ht 5\' 11"  (1.803 m)   Wt 113.4 kg   SpO2 100%   BMI 34.87 kg/m   Physical Exam Vitals signs and nursing note reviewed. Exam conducted with a chaperone present.  Constitutional:      General: He is not in acute distress.    Appearance: He is well-developed.     Comments: Appears nontoxic  HENT:     Head: Normocephalic and atraumatic.  Neck:     Musculoskeletal: Normal range of motion.  Pulmonary:     Effort: Pulmonary effort is normal.  Abdominal:     General: There is no distension.     Palpations: There is no mass.     Tenderness: There is no abdominal tenderness. There is no guarding or rebound.     Comments: No ttp of the abd  Genitourinary:      Comments: Packing present. No surrounding erythema or induration.  Musculoskeletal: Normal range of motion.  Skin:    General: Skin is  warm.     Findings: No rash.  Neurological:     Mental Status: He is alert and oriented to person, place, and time.      ED Treatments / Results  Labs (all labs ordered are listed, but only abnormal results are displayed) Labs Reviewed - No data to display  EKG None  Radiology No results found.  Procedures .Foreign Body Removal  Date/Time: 06/17/2019 7:46 AM Performed by: Alveria Apleyaccavale, Deontra Pereyra, PA-C Authorized by: Alveria Apleyaccavale, Alis Sawchuk, PA-C  Consent: Verbal consent obtained. Risks and benefits: risks, benefits and alternatives were discussed Consent given by: patient Patient understanding: patient states understanding of the procedure being performed Patient consent: the patient's understanding of the procedure matches consent given Procedure consent: procedure consent matches procedure scheduled Relevant documents: relevant documents  present and verified Test results: test results available and properly labeled Patient identity confirmed: verbally with patient Intake: perenium   Sedation: Patient sedated: no  Patient restrained: no Complexity: simple 1 objects recovered. Objects recovered: packing Post-procedure assessment: foreign body removed Patient tolerance: patient tolerated the procedure well with no immediate complications   (including critical care time)  Medications Ordered in ED Medications - No data to display   Initial Impression / Assessment and Plan / ED Course  I have reviewed the triage vital signs and the nursing notes.  Pertinent labs & imaging results that were available during my care of the patient were reviewed by me and considered in my medical decision making (see chart for details).        Patient presenting for packing removal.  On exam, packing material notes without concerning signs of infection including erythema or induration.  Packing removed as described above.  Discussed aftercare instructions.  Encourage follow-up with general surgery if any further issues.  At this time, patient appears safe for discharge.  Return precautions given.  Patient states he understands and agrees to plan.   Final Clinical Impressions(s) / ED Diagnoses   Final diagnoses:  Abscess packing removal    ED Discharge Orders    None       Alveria ApleyCaccavale, Jquan Egelston, PA-C 06/17/19 0748    Mesner, Barbara CowerJason, MD 06/18/19 [phone]

## 2019-06-17 NOTE — ED Triage Notes (Signed)
Pt presents to ED POV from home. Pt says that he was seen here on fri and had an abscess drained. He says it's been doing well until today when he was changing the dressing the dressing was stuck. Pt did not want to pull it out.

## 2019-06-17 NOTE — Discharge Instructions (Addendum)
Continue cleaning and taking medications as prescribed.  Follow up with general surgery as needed for further evaluation or management.  Return to the ER with any new, worsening, or concerning symptoms.

## 2019-06-18 ENCOUNTER — Telehealth: Payer: Self-pay

## 2019-06-18 NOTE — Telephone Encounter (Signed)
Post ED Visit - Positive Culture Follow-up  Culture report reviewed by antimicrobial stewardship pharmacist: [facility] Team []  Elenor Quinones, Pharm.D. []  Heide Guile, Pharm.D., BCPS AQ-ID []  Parks Neptune, Pharm.D., BCPS []  Alycia Rossetti, Pharm.D., BCPS []  [place], Pharm.D., BCPS, AAHIVP []  Legrand Como, Pharm.D., BCPS, AAHIVP []  Salome Arnt, PharmD, BCPS []  Johnnette Gourd, PharmD, BCPS []  Hughes Better, PharmD, BCPS []  Leeroy Cha, PharmD [x]  Laqueta Linden, PharmD, BCPS []  Albertina Parr, PharmD  [facility] Team []  Leodis Sias, PharmD []  Lindell Spar, PharmD []  Royetta Asal, PharmD []  Graylin Shiver, Rph []  Rema Fendt) Glennon Mac, PharmD []  Arlyn Dunning, PharmD []  Netta Cedars, PharmD []  Dia Sitter, PharmD []  Leone Haven, PharmD []  Gretta Arab, PharmD []  Theodis Shove, PharmD []  Peggyann Juba, PharmD []  Reuel Boom, PharmD   Positive aerobic culture Treated with amoxicillin, organism sensitive to the same and no further patient follow-up is required at this time.  Genia Del 06/18/2019, 11:42 AM

## 2019-06-19 LAB — CULTURE, BLOOD (ROUTINE X 2)
Culture: NO GROWTH
Culture: NO GROWTH
Special Requests: ADEQUATE

## 2020-05-16 ENCOUNTER — Ambulatory Visit (HOSPITAL_COMMUNITY)
Admission: EM | Admit: 2020-05-16 | Discharge: 2020-05-16 | Disposition: A | Discharge status: Home / Self Care | Payer: No Typology Code available for payment source | Attending: Internal Medicine | Admitting: Internal Medicine

## 2020-05-16 ENCOUNTER — Other Ambulatory Visit: Payer: Self-pay

## 2020-05-16 ENCOUNTER — Encounter (HOSPITAL_COMMUNITY): Payer: Self-pay

## 2020-05-16 DIAGNOSIS — J029 Acute pharyngitis, unspecified: Secondary | ICD-10-CM | POA: Diagnosis present

## 2020-05-16 DIAGNOSIS — Z20822 Contact with and (suspected) exposure to covid-19: Secondary | ICD-10-CM | POA: Diagnosis not present

## 2020-05-16 MED ORDER — CVS SORE THROAT SPRAY 1.4 % MT LIQD: 1.0000 | OROMUCOSAL | 0 refills | Status: DC | PRN

## 2020-05-16 MED ORDER — CEPACOL 15-2.3 MG MT LOZG: 1.0000 | LOZENGE | OROMUCOSAL | Status: DC | PRN

## 2020-05-16 NOTE — ED Provider Notes (Signed)
[facility]    CSN: [id number] Arrival date & time: 05/16/20  0947      History   Chief Complaint Chief Complaint  Patient presents with  . Sore Throat    HPI Shawn Molina is a 39 y.o. male comes to the urgent care with a 2-day history of sore throat and headaches.  Patient denies any fever or chills.  No odynophagia.  No difficulty swallowing.  No nauseoa, vomiting or abdominal pain.  No sick contacts.  No loss of taste or smell.  Patient denies any shortness of breath, cough, sputum production or wheezing.  Patient has not had a COVID-19 vaccine yet.  He has not been infected with COVID-19 virus to date.   HPI  Past Medical History:  Diagnosis Date  . H/O left knee surgery     There are no problems to display for this patient.   Past Surgical History:  Procedure Laterality Date  . KNEE SURGERY Left        Home Medications    Prior to Admission medications   Medication Sig Start Date End Date Taking? Authorizing Provider  acetaminophen (TYLENOL) 325 MG tablet Take 975 mg by mouth every 6 (six) hours as needed for moderate pain.   Yes [provider]  Benzocaine-Menthol (CEPACOL) 15-2.3 MG LOZG Use as directed 1 lozenge in the mouth or throat as needed. 05/16/20   Josy Peaden, Myrene Galas, MD  phenol ([employer] SORE THROAT SPRAY) 1.4 % LIQD Use as directed 1 spray in the mouth or throat as needed for throat irritation / pain. 05/16/20   Curtistine Pettitt, Myrene Galas, MD  traMADol (ULTRAM) 50 MG tablet Take 1 tablet (50 mg total) by mouth every 6 (six) hours as needed for severe pain. 06/11/19   Wieters, Elesa Hacker, PA-C    Family History Family History  Problem Relation Age of Onset  . Cancer Mother     Social History Social History   Tobacco Use  . Smoking status: Never Smoker  . Smokeless tobacco: Never Used  Vaping Use  . Vaping Use: Never used  Substance Use Topics  . Alcohol use: Yes    Comment: social  . Drug use: Yes    Types: Marijuana    Comment:  occ     Allergies   Patient has no known allergies.   Review of Systems Review of Systems  HENT: Positive for sore throat. Negative for ear discharge, ear pain, facial swelling, mouth sores, sinus pressure and sinus pain.   Gastrointestinal: Negative.   Genitourinary: Negative.   Musculoskeletal: Negative.   Psychiatric/Behavioral: Negative for confusion and decreased concentration.     Physical Exam Triage Vital Signs ED Triage Vitals  Enc Vitals Group     BP 05/16/20 1059 (!) 134/96     Pulse Rate 05/16/20 1059 90     Resp 05/16/20 1059 17     Temp 05/16/20 1059 98.5 F (36.9 C)     Temp Source 05/16/20 1059 Oral     SpO2 05/16/20 1059 98 %     Weight --      Height --      Head Circumference --      Peak Flow --      Pain Score 05/16/20 1057 2     Pain Loc --      Pain Edu? --      Excl. in [place]? --    No data found.  Updated Vital Signs BP (!) 134/96 (BP Location:  Right Arm)   Pulse 90   Temp 98.5 F (36.9 C) (Oral)   Resp 17   SpO2 98%   Visual Acuity Right Eye Distance:   Left Eye Distance:   Bilateral Distance:    Right Eye Near:   Left Eye Near:    Bilateral Near:     Physical Exam Vitals and nursing note reviewed.  Constitutional:      General: He is not in acute distress.    Appearance: He is well-developed. He is not ill-appearing.  HENT:     Mouth/Throat:     Mouth: Mucous membranes are moist.     Pharynx: Posterior oropharyngeal erythema present.     Tonsils: No tonsillar exudate or tonsillar abscesses. 0 on the right. 0 on the left.  Cardiovascular:     Rate and Rhythm: Normal rate and regular rhythm.  Skin:    Capillary Refill: Capillary refill takes less than 2 seconds.  Neurological:     Mental Status: He is alert.      UC Treatments / Results  Labs (all labs ordered are listed, but only abnormal results are displayed) Labs Reviewed  SARS CORONAVIRUS 2 (TAT 6-24 HRS)    EKG   Radiology No results  found.  Procedures Procedures (including critical care time)  Medications Ordered in UC Medications - No data to display  Initial Impression / Assessment and Plan / UC Course  I have reviewed the triage vital signs and the nursing notes.  Pertinent labs & imaging results that were available during my care of the patient were reviewed by me and considered in my medical decision making (see chart for details).     1.  Viral pharyngitis: COVID-19 PCR test has been sent Warm salt water gargle Tylenol and Motrin as needed Cepacol lozenges and phenol throat spray. Return to urgent care if symptoms worsen Quarantine until COVID-19 test results are available Return precautions given. Final Clinical Impressions(s) / UC Diagnoses   Final diagnoses:  Viral pharyngitis   Discharge Instructions   None    ED Prescriptions    Medication Sig Dispense Auth. Provider   phenol ([employer] SORE THROAT SPRAY) 1.4 % LIQD Use as directed 1 spray in the mouth or throat as needed for throat irritation / pain. 236 mL Tarra Pence, Britta Mccreedy, MD   Benzocaine-Menthol (CEPACOL) 15-2.3 MG LOZG Use as directed 1 lozenge in the mouth or throat as needed.  Patsy Zaragoza, Britta Mccreedy, MD     PDMP not reviewed this encounter.   Merrilee Jansky, MD 05/16/20 [phone]

## 2020-05-16 NOTE — ED Triage Notes (Signed)
Pt c/o sore throat and HA for approx 3 days and has been using warm salt water gargles and hot tea with temporary improvement of symptoms. Also reports subjective fever 3 days ago, now resolved. Denies chills, n/v/d, abdom pain, body aches, congestion, runny nose, cough or SOB.  Pt reports h/o strep throat.

## 2020-05-17 LAB — SARS CORONAVIRUS 2 (TAT 6-24 HRS): SARS Coronavirus 2: NEGATIVE

## 2020-06-16 ENCOUNTER — Encounter (HOSPITAL_COMMUNITY): Payer: Self-pay

## 2020-06-16 ENCOUNTER — Emergency Department (HOSPITAL_COMMUNITY)
Admission: EM | Admit: 2020-06-16 | Discharge: 2020-06-16 | Disposition: A | Discharge status: Home / Self Care | Payer: No Typology Code available for payment source | Attending: Emergency Medicine | Admitting: Emergency Medicine

## 2020-06-16 ENCOUNTER — Other Ambulatory Visit: Payer: Self-pay

## 2020-06-16 DIAGNOSIS — R197 Diarrhea, unspecified: Secondary | ICD-10-CM | POA: Diagnosis not present

## 2020-06-16 DIAGNOSIS — R112 Nausea with vomiting, unspecified: Secondary | ICD-10-CM | POA: Diagnosis present

## 2020-06-16 LAB — COMPREHENSIVE METABOLIC PANEL
ALT: 63 U/L — ABNORMAL HIGH (ref 0–44)
AST: 51 U/L — ABNORMAL HIGH (ref 15–41)
Albumin: 4.5 g/dL (ref 3.5–5.0)
Alkaline Phosphatase: 79 U/L (ref 38–126)
Anion gap: 10 (ref 5–15)
BUN: 12 mg/dL (ref 6–20)
CO2: 21 mmol/L — ABNORMAL LOW (ref 22–32)
Calcium: 9.6 mg/dL (ref 8.9–10.3)
Chloride: 103 mmol/L (ref 98–111)
Creatinine, Ser: 1.11 mg/dL (ref 0.61–1.24)
GFR calc Af Amer: >60 mL/min (ref >60)
GFR calc non Af Amer: >60 mL/min (ref >60)
Glucose, Bld: 132 mg/dL — ABNORMAL HIGH (ref 70–99)
Potassium: 3.9 mmol/L (ref 3.5–5.1)
Sodium: 134 mmol/L — ABNORMAL LOW (ref 135–145)
Total Bilirubin: 0.9 mg/dL (ref 0.3–1.2)
Total Protein: 9 g/dL — ABNORMAL HIGH (ref 6.5–8.1)

## 2020-06-16 LAB — URINALYSIS, ROUTINE W REFLEX MICROSCOPIC
Bilirubin Urine: NEGATIVE
Glucose, UA: NEGATIVE mg/dL
Hgb urine dipstick: NEGATIVE
Ketones, ur: NEGATIVE mg/dL
Leukocytes,Ua: NEGATIVE
Nitrite: NEGATIVE
Protein, ur: 100 mg/dL — AB
Specific Gravity, Urine: 1.039 — ABNORMAL HIGH (ref 1.005–1.030)
pH: 5 (ref 5.0–8.0)

## 2020-06-16 LAB — CBC
HCT: 49.8 % (ref 39.0–52.0)
Hemoglobin: 16.4 g/dL (ref 13.0–17.0)
MCH: 28.6 pg (ref 26.0–34.0)
MCHC: 32.9 g/dL (ref 30.0–36.0)
MCV: 86.9 fL (ref 80.0–100.0)
Platelets: 212 10*3/uL (ref 150–400)
RBC: 5.73 MIL/uL (ref 4.22–5.81)
RDW: 14.8 % (ref 11.5–15.5)
WBC: 6 10*3/uL (ref 4.0–10.5)
nRBC: 0 % (ref 0.0–0.2)

## 2020-06-16 LAB — LIPASE, BLOOD: Lipase: 32 U/L (ref 11–51)

## 2020-06-16 MED ORDER — FAMOTIDINE IN NACL 20-0.9 MG/50ML-% IV SOLN
20.0000 mg | Freq: Once | INTRAVENOUS | Status: AC
  Administered 2020-06-16: 20 mg via INTRAVENOUS
  Filled 2020-06-16: qty 50

## 2020-06-16 MED ORDER — SODIUM CHLORIDE 0.9 % IV BOLUS
1000.0000 mL | Freq: Once | INTRAVENOUS | Status: AC
  Administered 2020-06-16: 1000 mL via INTRAVENOUS

## 2020-06-16 MED ORDER — LOPERAMIDE HCL 2 MG PO CAPS: 2.0000 mg | ORAL_CAPSULE | Freq: Four times a day (QID) | ORAL | 0 refills | Status: AC | PRN

## 2020-06-16 MED ORDER — ONDANSETRON HCL 4 MG/2ML IJ SOLN
4.0000 mg | Freq: Once | INTRAMUSCULAR | Status: AC
  Administered 2020-06-16: 4 mg via INTRAVENOUS
  Filled 2020-06-16: qty 2

## 2020-06-16 MED ORDER — FAMOTIDINE 20 MG PO TABS
20.0000 mg | ORAL_TABLET | Freq: Two times a day (BID) | ORAL | 0 refills | Status: AC
Start: 2020-06-16 — End: ?

## 2020-06-16 MED ORDER — LOPERAMIDE HCL 2 MG PO CAPS
4.0000 mg | ORAL_CAPSULE | Freq: Once | ORAL | Status: AC
  Administered 2020-06-16: 4 mg via ORAL
  Filled 2020-06-16: qty 2

## 2020-06-16 NOTE — ED Notes (Signed)
Patient verbalizes understanding of discharge instructions. Opportunity for questioning and answers were provided. Pt discharged from ED. 

## 2020-06-16 NOTE — ED Triage Notes (Signed)
Pt reports multiple episodes of diarrhea that started yesterday and one episode of emesis. Pt reports his abd pain was relieved after vomiting. Denies any blood in his stool.

## 2020-06-16 NOTE — Discharge Instructions (Addendum)
You were seen in the emergency department for nausea vomiting and diarrhea.  Your symptoms improved with fluids and medication.  We are prescribing you some acid medication and diarrhea medication to use as needed.  Please keep well-hydrated.  Follow-up with your doctor and return to the emergency department for any worsening or concerning symptoms.

## 2020-06-16 NOTE — ED Provider Notes (Signed)
[facility] EMERGENCY DEPARTMENT Provider Note   CSN: [id number] Arrival date & time: 06/16/20  2440     History Chief Complaint  Patient presents with  . Diarrhea  . Abdominal Pain    Shawn Molina is a 39 y.o. male.  He has no significant past medical history.  He said he ate some [place] food yesterday evening and then started with some crampy low abdominal pain and 5 episodes of diarrhea nonbloody.  This morning he woke up and vomited x1.  No fevers or chills.  No sick contacts.  No recent travel no drinking from stream.  Denies any injectable drugs or alcohol.  Smokes marijuana.  The history is provided by the patient.  Diarrhea Quality:  Watery Severity:  Moderate Onset quality:  Sudden Number of episodes:  5 Timing:  Intermittent Progression:  Unchanged Relieved by:  Nothing Worsened by:  Nothing Ineffective treatments:  None tried Associated symptoms: abdominal pain and vomiting   Associated symptoms: no chills, no recent cough, no diaphoresis, no fever, no headaches, no myalgias and no URI   Risk factors: no recent antibiotic use, no sick contacts and no travel to endemic areas   Abdominal Pain Associated symptoms: diarrhea, nausea and vomiting   Associated symptoms: no chest pain, no chills, no dysuria, no fever, no shortness of breath and no sore throat        Past Medical History:  Diagnosis Date  . H/O left knee surgery     There are no problems to display for this patient.   Past Surgical History:  Procedure Laterality Date  . KNEE SURGERY Left        Family History  Problem Relation Age of Onset  . Cancer Mother     Social History   Tobacco Use  . Smoking status: Never Smoker  . Smokeless tobacco: Never Used  Vaping Use  . Vaping Use: Never used  Substance Use Topics  . Alcohol use: Yes    Comment: social  . Drug use: Yes    Types: Marijuana    Comment: occ    Home Medications Prior to Admission  medications   Medication Sig Start Date End Date Taking? Authorizing Provider  acetaminophen (TYLENOL) 325 MG tablet Take 975 mg by mouth every 6 (six) hours as needed for moderate pain.    [provider]  Benzocaine-Menthol (CEPACOL) 15-2.3 MG LOZG Use as directed 1 lozenge in the mouth or throat as needed. 05/16/20   Lamptey, Britta Mccreedy, MD  phenol ([employer] SORE THROAT SPRAY) 1.4 % LIQD Use as directed 1 spray in the mouth or throat as needed for throat irritation / pain. 05/16/20   Lamptey, Britta Mccreedy, MD  traMADol (ULTRAM) 50 MG tablet Take 1 tablet (50 mg total) by mouth every 6 (six) hours as needed for severe pain. 06/11/19   Wieters, Hallie C, PA-C    Allergies    Patient has no known allergies.  Review of Systems   Review of Systems  Constitutional: Negative for chills, diaphoresis and fever.  HENT: Negative for sore throat.   Eyes: Negative for visual disturbance.  Respiratory: Negative for shortness of breath.   Cardiovascular: Negative for chest pain.  Gastrointestinal: Positive for abdominal pain, diarrhea, nausea and vomiting.  Genitourinary: Negative for dysuria.  Musculoskeletal: Negative for myalgias.  Skin: Negative for rash.  Neurological: Negative for headaches.    Physical Exam Updated Vital Signs BP (!) 126/95   Pulse 96   Temp 98.5  F (36.9 C)   Resp 18   Ht 5\' 11"  (1.803 m)   Wt 115.7 kg   SpO2 100%   BMI 35.57 kg/m   Physical Exam Vitals and nursing note reviewed.  Constitutional:      Appearance: He is well-developed.  HENT:     Head: Normocephalic and atraumatic.  Eyes:     Conjunctiva/sclera: Conjunctivae normal.  Cardiovascular:     Rate and Rhythm: Normal rate and regular rhythm.     Heart sounds: No murmur heard.   Pulmonary:     Effort: Pulmonary effort is normal. No respiratory distress.     Breath sounds: Normal breath sounds.  Abdominal:     Palpations: Abdomen is soft.     Tenderness: There is no abdominal tenderness. There is  no guarding or rebound.  Musculoskeletal:        General: No deformity or signs of injury. Normal range of motion.     Cervical back: Neck supple.  Skin:    General: Skin is warm and dry.     Capillary Refill: Capillary refill takes less than 2 seconds.  Neurological:     General: No focal deficit present.     Mental Status: He is alert.     ED Results / Procedures / Treatments   Labs (all labs ordered are listed, but only abnormal results are displayed) Labs Reviewed  COMPREHENSIVE METABOLIC PANEL - Abnormal; Notable for the following components:      Result Value   Sodium 134 (*)    CO2 21 (*)    Glucose, Bld 132 (*)    Total Protein 9.0 (*)    AST 51 (*)    ALT 63 (*)    All other components within normal limits  URINALYSIS, ROUTINE W REFLEX MICROSCOPIC - Abnormal; Notable for the following components:   Color, Urine AMBER (*)    APPearance HAZY (*)    Specific Gravity, Urine 1.039 (*)    Protein, ur 100 (*)    Bacteria, UA RARE (*)    All other components within normal limits  LIPASE, BLOOD  CBC    EKG None  Radiology No results found.  Procedures Procedures (including critical care time)  Medications Ordered in ED Medications  sodium chloride 0.9 % bolus 1,000 mL (has no administration in time range)  ondansetron (ZOFRAN) injection 4 mg (has no administration in time range)  famotidine (PEPCID) IVPB 20 mg premix (has no administration in time range)  loperamide (IMODIUM) capsule 4 mg (has no administration in time range)    ED Course  I have reviewed the triage vital signs and the nursing notes.  Pertinent labs & imaging results that were available during my care of the patient were reviewed by me and considered in my medical decision making (see chart for details).  Clinical Course as of Jun 17 1815  Mon Jun 16, 2020  1220 Reassessed patient and he feels much better.  Tolerating fluids.  Will discharge   [MB]    Clinical Course User Index [MB]  Jun 18, 2020, MD   MDM Rules/Calculators/A&P                         This patient complains of nausea vomiting diarrhea; this involves an extensive number of treatment Options and is a complaint that carries with it a high risk of complications and Morbidity. The differential includes gastroenteritis, diverticulitis, colitis, infectious enteritis  I ordered, reviewed and  interpreted labs, which included CBC with normal white count normal hemoglobin, chemistries with slightly low bicarb elevated glucose reflecting some dehydration, AST and ALT mildly elevated, has been seen on prior lab values, urinalysis concentrated no evidence of infection, normal lipase. I ordered medication IV fluids Pepcid Imodium Zofran with improvement in his symptoms Previous records obtained and reviewed in epic, no significant ones.  After the interventions stated above, I reevaluated the patient and found patient to be symptomatically improved.  Repeat abdominal exam shows no focal tenderness.  I do not think he needs imaging currently.  Likely gastroenteritis.  Tolerating p.o. now.  Return instructions discussed.   Final Clinical Impression(s) / ED Diagnoses Final diagnoses:  Nausea vomiting and diarrhea    Rx / DC Orders ED Discharge Orders         Ordered    loperamide (IMODIUM) 2 MG capsule  4 times daily PRN     Discontinue  Reprint     06/16/20 1222    famotidine (PEPCID) 20 MG tablet  2 times daily     Discontinue  Reprint     06/16/20 1222           Terrilee Files, MD 06/16/20 1818

## 2022-01-14 ENCOUNTER — Encounter (HOSPITAL_COMMUNITY): Payer: Self-pay

## 2022-01-14 ENCOUNTER — Ambulatory Visit (HOSPITAL_COMMUNITY)
Admission: EM | Admit: 2022-01-14 | Discharge: 2022-01-14 | Disposition: A | Discharge status: Home / Self Care | Payer: No Typology Code available for payment source | Attending: Urgent Care | Admitting: Urgent Care

## 2022-01-14 DIAGNOSIS — J3489 Other specified disorders of nose and nasal sinuses: Secondary | ICD-10-CM | POA: Insufficient documentation

## 2022-01-14 DIAGNOSIS — R519 Headache, unspecified: Secondary | ICD-10-CM | POA: Diagnosis present

## 2022-01-14 DIAGNOSIS — Z20822 Contact with and (suspected) exposure to covid-19: Secondary | ICD-10-CM | POA: Diagnosis not present

## 2022-01-14 DIAGNOSIS — B349 Viral infection, unspecified: Secondary | ICD-10-CM | POA: Diagnosis not present

## 2022-01-14 MED ORDER — FLUTICASONE PROPIONATE 50 MCG/ACT NA SUSP: 1.0000 | Freq: Every day | NASAL | 0 refills | Status: AC

## 2022-01-14 MED ORDER — DEXAMETHASONE 6 MG PO TABS: 6.0000 mg | ORAL_TABLET | Freq: Every day | ORAL | 0 refills | Status: AC

## 2022-01-14 NOTE — ED Provider Notes (Signed)
[place]    CSN: [id number] Arrival date & time: 01/14/22  1409      History   Chief Complaint Chief Complaint  Patient presents with   URI    HPI Shawn Molina is a 41 y.o. male.   Pleasant 41 year old male presents today with an acute onset of sinus pain and headache.  He states it woke him from sleep around 3 AM this morning.  He states been taking over-the-counter medication which helps minimally.  He has a temperature of 100 degrees in the office, he denies having checked his temp at home.  He denies body aches or chills.  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  He states the headache hurts in his frontal and maxillary sinuses but is not reproducible to pressure.  He also states it feels like there is pressure behind his eyes.  He reports some discomfort with looking at the light but denies any nuchal rigidity.  He denies any ear pain, sore throat, cough.  He states it feels tight in his chest, but denies any chest pain or palpitations.  He denies any history of COPD or asthma.  He does smoke.   URI Presenting symptoms: fever   Associated symptoms: headaches and sinus pain    Past Medical History:  Diagnosis Date   H/O left knee surgery     There are no problems to display for this patient.   Past Surgical History:  Procedure Laterality Date   KNEE SURGERY Left        Home Medications    Prior to Admission medications   Medication Sig Start Date End Date Taking? Authorizing Provider  dexamethasone (DECADRON) 6 MG tablet Take 1 tablet (6 mg total) by mouth daily for 5 days. 01/14/22 01/19/22 Yes Nyella Eckels L, PA  fluticasone (FLONASE) 50 MCG/ACT nasal spray Place 1 spray into both nostrils daily. 01/14/22  Yes Mariafernanda Hendricksen L, PA  acetaminophen (TYLENOL) 325 MG tablet Take 975 mg by mouth every 6 (six) hours as needed for moderate pain.    [provider]  bismuth subsalicylate (PEPTO BISMOL) 262 MG chewable  tablet Chew 524 mg by mouth as needed for diarrhea or loose stools.    [provider]  famotidine (PEPCID) 20 MG tablet Take 1 tablet (20 mg total) by mouth 2 (two) times daily. 06/16/20   Hayden Rasmussen, MD  loperamide (IMODIUM) 2 MG capsule Take 1 capsule (2 mg total) by mouth 4 (four) times daily as needed for diarrhea or loose stools. 06/16/20   Hayden Rasmussen, MD    Family History Family History  Problem Relation Age of Onset   Cancer Mother     Social History Social History   Tobacco Use   Smoking status: Never   Smokeless tobacco: Never  Vaping Use   Vaping Use: Never used  Substance Use Topics   Alcohol use: Yes    Comment: social   Drug use: Yes    Types: Marijuana    Comment: occ     Allergies   Patient has no known allergies.   Review of Systems Review of Systems  Constitutional:  Positive for fever.  HENT:  Positive for sinus pressure and sinus pain.   Neurological:  Positive for headaches.  All other systems reviewed and are negative.   Physical Exam Triage Vital Signs ED Triage Vitals  Enc Vitals Group     BP 01/14/22 1557 122/76  Pulse Rate 01/14/22 1557 87     Resp 01/14/22 1557 18     Temp 01/14/22 1557 100 F (37.8 C)     Temp Source 01/14/22 1557 Oral     SpO2 01/14/22 1557 99 %     Weight --      Height --      Head Circumference --      Peak Flow --      Pain Score 01/14/22 1559 6     Pain Loc --      Pain Edu? --      Excl. in [place]? --    No data found.  Updated Vital Signs BP 122/76 (BP Location: Right Arm)    Pulse 87    Temp 100 F (37.8 C) (Oral)    Resp 18    SpO2 99%   Visual Acuity Right Eye Distance:   Left Eye Distance:   Bilateral Distance:    Right Eye Near:   Left Eye Near:    Bilateral Near:     Physical Exam Vitals and nursing note reviewed.  Constitutional:      General: He is not in acute distress.    Appearance: Normal appearance. He is well-developed. He is obese. He is not  ill-appearing, toxic-appearing or diaphoretic.  HENT:     Head: Normocephalic and atraumatic.     Right Ear: Tympanic membrane, ear canal and external ear normal. There is no impacted cerumen.     Left Ear: Tympanic membrane, ear canal and external ear normal. There is no impacted cerumen.     Nose: Nose normal. No congestion or rhinorrhea.     Right Sinus: No maxillary sinus tenderness or frontal sinus tenderness.     Left Sinus: No maxillary sinus tenderness or frontal sinus tenderness.     Mouth/Throat:     Mouth: Mucous membranes are moist.     Pharynx: Oropharynx is clear. No oropharyngeal exudate or posterior oropharyngeal erythema.  Eyes:     General: No scleral icterus.       Right eye: No discharge.        Left eye: No discharge.     Extraocular Movements: Extraocular movements intact.     Conjunctiva/sclera: Conjunctivae normal.     Pupils: Pupils are equal, round, and reactive to light.  Cardiovascular:     Rate and Rhythm: Normal rate and regular rhythm.     Pulses: Normal pulses.     Heart sounds: Normal heart sounds. No murmur heard.   No friction rub. No gallop.  Pulmonary:     Effort: Pulmonary effort is normal. No respiratory distress.     Breath sounds: Normal breath sounds. No stridor. No wheezing, rhonchi or rales.  Chest:     Chest wall: No tenderness.  Abdominal:     Palpations: Abdomen is soft.     Tenderness: There is no abdominal tenderness.  Musculoskeletal:        General: No swelling.     Cervical back: Normal range of motion and neck supple. No rigidity or tenderness.  Lymphadenopathy:     Cervical: No cervical adenopathy.  Skin:    General: Skin is warm and dry.     Capillary Refill: Capillary refill takes less than 2 seconds.  Neurological:     Mental Status: He is alert.  Psychiatric:        Mood and Affect: Mood normal.     UC Treatments / Results  Labs (all labs ordered are  listed, but only abnormal results are displayed) Labs  Reviewed  SARS CORONAVIRUS 2 (TAT 6-24 HRS)    EKG   Radiology No results found.  Procedures Procedures (including critical care time)  Medications Ordered in UC Medications - No data to display  Initial Impression / Assessment and Plan / UC Course  I have reviewed the triage vital signs and the nursing notes.  Pertinent labs & imaging results that were available during my care of the patient were reviewed by me and considered in my medical decision making (see chart for details).     Viral syndrome - given timeframe to onset, bacterial sinusitis unlikely. Covid swab completed today. Will start dexamethasone to aid in sx improvement Sinus headache - supportive measures as discussed below. ER precautions discussed extensively should sx progress. No signs of meningitis in clinic.  Final Clinical Impressions(s) / UC Diagnoses   Final diagnoses:  Viral syndrome  Sinus headache     Discharge Instructions      Your symptoms are likely due to a virus. We did test you for covid and will call when the results are received. You can start taking dexamethasone to help with your headache and sinus pressure. Please take in the mornings to prevent insomnia. Purchase a warm mist vaporizer for use at nighttime, continue using steam from shower. Please start using the nasal spray prescribed today, 1-2 times daily per nostril.  Stop use if nasal bleeding occurs. If any worsening headache, change in vision, neck stiffness or worsening fever occurs, please head to ER     ED Prescriptions     Medication Sig Dispense Auth. Provider   dexamethasone (DECADRON) 6 MG tablet Take 1 tablet (6 mg total) by mouth daily for 5 days. 5 tablet Torin Whisner L, PA   fluticasone (FLONASE) 50 MCG/ACT nasal spray Place 1 spray into both nostrils daily. 16 g Janeil Schexnayder L, [place]      PDMP not reviewed this encounter.   Chaney Malling, [place] 01/14/22 [phone]

## 2022-01-14 NOTE — Discharge Instructions (Addendum)
Your symptoms are likely due to a virus. We did test you for covid and will call when the results are received. You can start taking dexamethasone to help with your headache and sinus pressure. Please take in the mornings to prevent insomnia. Purchase a warm mist vaporizer for use at nighttime, continue using steam from shower. Please start using the nasal spray prescribed today, 1-2 times daily per nostril.  Stop use if nasal bleeding occurs. If any worsening headache, change in vision, neck stiffness or worsening fever occurs, please head to ER

## 2022-01-14 NOTE — ED Triage Notes (Signed)
Pt presents with nasal congestion and sinus pain since waking up this morning.

## 2022-01-15 LAB — SARS CORONAVIRUS 2 (TAT 6-24 HRS): SARS Coronavirus 2: NEGATIVE

## 2022-09-17 ENCOUNTER — Emergency Department (HOSPITAL_COMMUNITY)
Admission: EM | Admit: 2022-09-17 | Discharge: 2022-09-17 | Discharge status: Against Medical Advice | Payer: Self-pay | Attending: Student | Admitting: Student

## 2022-09-17 ENCOUNTER — Other Ambulatory Visit: Payer: Self-pay

## 2022-09-17 ENCOUNTER — Encounter (HOSPITAL_COMMUNITY): Payer: Self-pay

## 2022-09-17 ENCOUNTER — Emergency Department (HOSPITAL_COMMUNITY): Payer: Self-pay

## 2022-09-17 DIAGNOSIS — E119 Type 2 diabetes mellitus without complications: Secondary | ICD-10-CM | POA: Insufficient documentation

## 2022-09-17 DIAGNOSIS — I10 Essential (primary) hypertension: Secondary | ICD-10-CM | POA: Insufficient documentation

## 2022-09-17 DIAGNOSIS — Z5321 Procedure and treatment not carried out due to patient leaving prior to being seen by health care provider: Secondary | ICD-10-CM | POA: Insufficient documentation

## 2022-09-17 DIAGNOSIS — H538 Other visual disturbances: Secondary | ICD-10-CM | POA: Insufficient documentation

## 2022-09-17 DIAGNOSIS — R519 Headache, unspecified: Secondary | ICD-10-CM | POA: Insufficient documentation

## 2022-09-17 LAB — CBC WITH DIFFERENTIAL/PLATELET
Abs Immature Granulocytes: 0.01 10*3/uL (ref 0.00–0.07)
Basophils Absolute: 0 10*3/uL (ref 0.0–0.1)
Basophils Relative: 0 %
Eosinophils Absolute: 0 10*3/uL (ref 0.0–0.5)
Eosinophils Relative: 0 %
HCT: 46.3 % (ref 39.0–52.0)
Hemoglobin: 15.8 g/dL (ref 13.0–17.0)
Immature Granulocytes: 0 %
Lymphocytes Relative: 29 %
Lymphs Abs: 2 10*3/uL (ref 0.7–4.0)
MCH: 29.4 pg (ref 26.0–34.0)
MCHC: 34.1 g/dL (ref 30.0–36.0)
MCV: 86.1 fL (ref 80.0–100.0)
Monocytes Absolute: 1.1 10*3/uL — ABNORMAL HIGH (ref 0.1–1.0)
Monocytes Relative: 16 %
Neutro Abs: 3.7 10*3/uL (ref 1.7–7.7)
Neutrophils Relative %: 55 %
Platelets: 218 10*3/uL (ref 150–400)
RBC: 5.38 MIL/uL (ref 4.22–5.81)
RDW: 14.8 % (ref 11.5–15.5)
WBC: 6.8 10*3/uL (ref 4.0–10.5)
nRBC: 0 % (ref 0.0–0.2)

## 2022-09-17 LAB — BASIC METABOLIC PANEL
Anion gap: 10 (ref 5–15)
BUN: 11 mg/dL (ref 6–20)
CO2: 27 mmol/L (ref 22–32)
Calcium: 9.7 mg/dL (ref 8.9–10.3)
Chloride: 100 mmol/L (ref 98–111)
Creatinine, Ser: 1.43 mg/dL — ABNORMAL HIGH (ref 0.61–1.24)
GFR, Estimated: >60 mL/min (ref >60)
Glucose, Bld: 126 mg/dL — ABNORMAL HIGH (ref 70–99)
Potassium: 3.4 mmol/L — ABNORMAL LOW (ref 3.5–5.1)
Sodium: 137 mmol/L (ref 135–145)

## 2022-09-17 NOTE — ED Provider Triage Note (Signed)
Emergency Medicine Provider Triage Evaluation Note  Shawn Molina , a 41 y.o. male  was evaluated in triage.  Pt complains of headache, going on for last couple days, headaches are intermittent, states that they gradually come on not the worst headache of his life, states she has some slight blurred vision but has improved, no paresthesias or weakness in the upper or lower extremities no neck pain or back pain no recent falls on anticoag's.  He has no history of CVAs or TIAs, he is not being treated for hypertension hyperlipidemia diabetes does not smoke.  He states that he has noticed that his blood pressure has been elevated max is 151, does not take any antihypertensive medication.  Review of Systems  Positive: Headache, blurred vision Negative: Paresthesias, weakness  Physical Exam  BP 115/80 (BP Location: Right Arm)   Pulse 90   Temp 98.7 F (37.1 C) (Oral)   Resp 16   Ht 5\' 11"  (1.803 m)   Wt 111.1 kg   SpO2 98%   BMI 34.17 kg/m  Gen:   Awake, no distress   Resp:  Normal effort  MSK:   Moves extremities without difficulty  Other:  Cranial nerves II through XII grossly intact no difficulty with word finding following two-step commands there is no unilateral weakness present.  Medical Decision Making  Medically screening exam initiated at 12:17 PM.  Appropriate orders placed.  Trentin D Seda was informed that the remainder of the evaluation will be completed by another provider, this initial triage assessment does not replace that evaluation, and the importance of remaining in the ED until their evaluation is complete.  Lab or imaging have been ordered will need further work-up, patient is outside the stroke window.   Marcello Fennel, PA-C 09/17/22 1219

## 2022-09-17 NOTE — ED Triage Notes (Signed)
Pt c/o intermittent HA x3 days with elevated B/P and intermittent blurred vision.

## 2022-09-17 NOTE — ED Notes (Signed)
Pt called multiple times with no answer.
